# Patient Record
Sex: Female | Born: 1940 | Race: White | Hispanic: No | Marital: Married | Smoking: Former smoker
Health system: Southern US, Community
[De-identification: ages and names within clinical notes are randomized; demographics above are authoritative.]

## PROBLEM LIST (undated history)

## (undated) DIAGNOSIS — I639 Cerebral infarction, unspecified: Secondary | ICD-10-CM

## (undated) DIAGNOSIS — C801 Malignant (primary) neoplasm, unspecified: Secondary | ICD-10-CM

## (undated) DIAGNOSIS — M1990 Unspecified osteoarthritis, unspecified site: Secondary | ICD-10-CM

## (undated) DIAGNOSIS — I10 Essential (primary) hypertension: Secondary | ICD-10-CM

## (undated) DIAGNOSIS — L719 Rosacea, unspecified: Secondary | ICD-10-CM

## (undated) DIAGNOSIS — L570 Actinic keratosis: Secondary | ICD-10-CM

## (undated) DIAGNOSIS — E785 Hyperlipidemia, unspecified: Secondary | ICD-10-CM

## (undated) DIAGNOSIS — L900 Lichen sclerosus et atrophicus: Secondary | ICD-10-CM

## (undated) DIAGNOSIS — E119 Type 2 diabetes mellitus without complications: Secondary | ICD-10-CM

## (undated) DIAGNOSIS — L80 Vitiligo: Secondary | ICD-10-CM

## (undated) HISTORY — DX: Vitiligo: L80

## (undated) HISTORY — DX: Rosacea, unspecified: L71.9

## (undated) HISTORY — DX: Type 2 diabetes mellitus without complications: E11.9

## (undated) HISTORY — DX: Hyperlipidemia, unspecified: E78.5

## (undated) HISTORY — DX: Lichen sclerosus et atrophicus: L90.0

## (undated) HISTORY — DX: Actinic keratosis: L57.0

## (undated) HISTORY — PX: TUBAL LIGATION: SHX77

## (undated) HISTORY — DX: Cerebral infarction, unspecified: I63.9

---

## 1989-02-24 HISTORY — PX: BREAST EXCISIONAL BIOPSY: SUR124

## 1989-02-24 HISTORY — PX: BREAST SURGERY: SHX581

## 2001-06-04 ENCOUNTER — Other Ambulatory Visit: Admission: RE | Admit: 2001-06-04 | Discharge: 2001-06-04 | Payer: Self-pay | Admitting: Family Medicine

## 2004-01-10 ENCOUNTER — Ambulatory Visit: Payer: Self-pay | Admitting: Family Medicine

## 2005-03-27 ENCOUNTER — Ambulatory Visit: Payer: Self-pay

## 2005-05-29 ENCOUNTER — Ambulatory Visit: Payer: Self-pay | Admitting: Gastroenterology

## 2006-04-01 ENCOUNTER — Ambulatory Visit: Payer: Self-pay

## 2007-04-15 ENCOUNTER — Ambulatory Visit: Payer: Self-pay | Admitting: Family Medicine

## 2008-04-20 ENCOUNTER — Ambulatory Visit: Payer: Self-pay | Admitting: Family Medicine

## 2008-05-09 ENCOUNTER — Ambulatory Visit: Payer: Self-pay | Admitting: Gastroenterology

## 2009-02-24 HISTORY — PX: ROTATOR CUFF REPAIR: SHX139

## 2009-04-10 LAB — HM PAP SMEAR: HM Pap smear: NORMAL

## 2009-04-24 ENCOUNTER — Ambulatory Visit: Payer: Self-pay | Admitting: Family Medicine

## 2009-04-26 ENCOUNTER — Ambulatory Visit: Payer: Self-pay | Admitting: Family Medicine

## 2009-07-01 HISTORY — PX: DILATION AND CURETTAGE OF UTERUS: SHX78

## 2009-09-09 ENCOUNTER — Emergency Department: Payer: Self-pay | Admitting: Emergency Medicine

## 2009-11-07 ENCOUNTER — Ambulatory Visit: Payer: Self-pay | Admitting: Specialist

## 2010-01-03 ENCOUNTER — Ambulatory Visit: Payer: Self-pay | Admitting: Specialist

## 2010-01-09 ENCOUNTER — Ambulatory Visit: Payer: Self-pay | Admitting: Anesthesiology

## 2010-01-10 ENCOUNTER — Ambulatory Visit: Payer: Self-pay | Admitting: Specialist

## 2010-11-25 HISTORY — PX: CATARACT EXTRACTION, BILATERAL: SHX1313

## 2010-12-04 ENCOUNTER — Ambulatory Visit: Payer: Self-pay | Admitting: Ophthalmology

## 2011-07-09 ENCOUNTER — Ambulatory Visit: Payer: Self-pay | Admitting: Family Medicine

## 2011-07-28 ENCOUNTER — Ambulatory Visit: Payer: Self-pay | Admitting: Family Medicine

## 2011-08-25 ENCOUNTER — Ambulatory Visit: Payer: Self-pay | Admitting: Family Medicine

## 2012-04-23 ENCOUNTER — Ambulatory Visit: Payer: Self-pay | Admitting: Family Medicine

## 2012-05-05 ENCOUNTER — Ambulatory Visit: Payer: Self-pay | Admitting: Family Medicine

## 2012-07-05 ENCOUNTER — Ambulatory Visit: Payer: Self-pay | Admitting: Family Medicine

## 2012-07-15 ENCOUNTER — Ambulatory Visit: Payer: Self-pay | Admitting: Family Medicine

## 2012-07-26 ENCOUNTER — Ambulatory Visit: Payer: Self-pay | Admitting: Family Medicine

## 2012-07-28 ENCOUNTER — Ambulatory Visit: Payer: Self-pay | Admitting: Family Medicine

## 2012-08-18 ENCOUNTER — Ambulatory Visit: Payer: Self-pay | Admitting: Family Medicine

## 2012-08-18 LAB — HM DEXA SCAN

## 2012-10-13 ENCOUNTER — Ambulatory Visit: Payer: Self-pay | Admitting: Ophthalmology

## 2012-10-22 HISTORY — PX: EYE SURGERY: SHX253

## 2013-02-24 HISTORY — PX: COLONOSCOPY: SHX174

## 2013-07-27 LAB — HEMOGLOBIN A1C: Hgb A1c MFr Bld: 7.1 % — AB (ref 4.0–6.0)

## 2013-08-02 DIAGNOSIS — I495 Sick sinus syndrome: Secondary | ICD-10-CM | POA: Insufficient documentation

## 2013-08-02 DIAGNOSIS — E139 Other specified diabetes mellitus without complications: Secondary | ICD-10-CM | POA: Insufficient documentation

## 2013-08-02 DIAGNOSIS — I499 Cardiac arrhythmia, unspecified: Secondary | ICD-10-CM | POA: Insufficient documentation

## 2013-08-02 DIAGNOSIS — I10 Essential (primary) hypertension: Secondary | ICD-10-CM | POA: Insufficient documentation

## 2013-11-01 ENCOUNTER — Ambulatory Visit: Payer: Self-pay | Admitting: Family Medicine

## 2013-11-01 LAB — HM MAMMOGRAPHY

## 2013-12-29 ENCOUNTER — Ambulatory Visit: Payer: Self-pay | Admitting: Gastroenterology

## 2014-01-08 LAB — HM COLONOSCOPY: HM COLON: NORMAL

## 2014-05-23 LAB — LIPID PANEL
CHOLESTEROL: 178 mg/dL (ref 0–200)
HDL: 63 mg/dL (ref 35–70)
LDL Cholesterol: 100 mg/dL
LDL/HDL RATIO: 2.8
TRIGLYCERIDES: 75 mg/dL (ref 40–160)

## 2014-06-22 DIAGNOSIS — M5412 Radiculopathy, cervical region: Secondary | ICD-10-CM | POA: Insufficient documentation

## 2014-07-25 DIAGNOSIS — D369 Benign neoplasm, unspecified site: Secondary | ICD-10-CM | POA: Insufficient documentation

## 2014-07-25 DIAGNOSIS — M542 Cervicalgia: Secondary | ICD-10-CM | POA: Insufficient documentation

## 2014-07-25 DIAGNOSIS — E7800 Pure hypercholesterolemia, unspecified: Secondary | ICD-10-CM | POA: Insufficient documentation

## 2014-07-25 DIAGNOSIS — S32000A Wedge compression fracture of unspecified lumbar vertebra, initial encounter for closed fracture: Secondary | ICD-10-CM | POA: Insufficient documentation

## 2014-07-25 DIAGNOSIS — R634 Abnormal weight loss: Secondary | ICD-10-CM | POA: Insufficient documentation

## 2014-07-25 DIAGNOSIS — K5900 Constipation, unspecified: Secondary | ICD-10-CM | POA: Insufficient documentation

## 2014-07-25 DIAGNOSIS — G939 Disorder of brain, unspecified: Secondary | ICD-10-CM | POA: Insufficient documentation

## 2014-07-25 DIAGNOSIS — L900 Lichen sclerosus et atrophicus: Secondary | ICD-10-CM | POA: Insufficient documentation

## 2014-07-25 DIAGNOSIS — M810 Age-related osteoporosis without current pathological fracture: Secondary | ICD-10-CM | POA: Insufficient documentation

## 2014-07-25 DIAGNOSIS — I6782 Cerebral ischemia: Secondary | ICD-10-CM | POA: Insufficient documentation

## 2014-07-25 DIAGNOSIS — M9979 Connective tissue and disc stenosis of intervertebral foramina of abdomen and other regions: Secondary | ICD-10-CM | POA: Insufficient documentation

## 2014-07-25 DIAGNOSIS — N12 Tubulo-interstitial nephritis, not specified as acute or chronic: Secondary | ICD-10-CM | POA: Insufficient documentation

## 2014-09-05 LAB — HEMOGLOBIN A1C: HEMOGLOBIN A1C: 6.7 % — AB (ref 4.0–6.0)

## 2014-09-05 LAB — BASIC METABOLIC PANEL
Creatinine: 0.8 mg/dL (ref <=1.1)
Glucose: 161 mg/dL
Potassium: 4.3 mmol/L (ref 3.4–5.3)
Sodium: 134 mmol/L — AB (ref 137–147)

## 2014-10-23 ENCOUNTER — Ambulatory Visit
Admission: RE | Admit: 2014-10-23 | Discharge: 2014-10-23 | Disposition: A | Discharge status: Home / Self Care | Payer: Commercial Managed Care - HMO | Source: Ambulatory Visit | Attending: Family Medicine | Admitting: Family Medicine

## 2014-10-23 ENCOUNTER — Encounter: Payer: Self-pay | Admitting: Family Medicine

## 2014-10-23 ENCOUNTER — Ambulatory Visit (INDEPENDENT_AMBULATORY_CARE_PROVIDER_SITE_OTHER): Payer: Commercial Managed Care - HMO | Admitting: Family Medicine

## 2014-10-23 VITALS — BP 140/80 | HR 70 | Temp 98.1°F | Resp 16 | Ht 66.5 in | Wt 114.8 lb

## 2014-10-23 DIAGNOSIS — M25552 Pain in left hip: Secondary | ICD-10-CM | POA: Insufficient documentation

## 2014-10-23 DIAGNOSIS — Z1239 Encounter for other screening for malignant neoplasm of breast: Secondary | ICD-10-CM

## 2014-10-23 DIAGNOSIS — E139 Other specified diabetes mellitus without complications: Secondary | ICD-10-CM

## 2014-10-23 DIAGNOSIS — Z0000 Encounter for general adult medical examination without abnormal findings: Secondary | ICD-10-CM | POA: Diagnosis not present

## 2014-10-23 DIAGNOSIS — M25559 Pain in unspecified hip: Secondary | ICD-10-CM | POA: Insufficient documentation

## 2014-10-23 DIAGNOSIS — M810 Age-related osteoporosis without current pathological fracture: Secondary | ICD-10-CM

## 2014-10-23 MED ORDER — MELOXICAM 15 MG PO TABS: 15.0000 mg | ORAL_TABLET | Freq: Every day | ORAL | Status: DC

## 2014-10-23 NOTE — Progress Notes (Signed)
Patient: Denise Sherman, Female    DOB: [birth year], 74 y.o.   MRN: [id number] Visit Date: 10/23/2014  Today's Provider: Margarita Rana, MD   Chief Complaint  Patient presents with  . Medicare Wellness  . Hip Pain   Subjective:    Annual wellness visit Denise Sherman is a 74 y.o. female. She feels well. She reports exercising walks twice a day 3-4 times a week . She reports she is sleeping well. Reviewed her PMH and all age appropriate screening as noted.   Follows a healthy diet. Does follow up with endocrinologist for her diabetes.   10/19/13 CPE 04/10/09 PAP-neg 11/01/13 Mammo-BI-RADS 1 12/29/13 Colon-Normal 08/18/12 BMD Osteoporosis 07/03/11 EKG   Also having hip pain. Has seen chiropractor which helped initially, but is no longer helping.   Does not hurt when lying down. Hurts when first starts moving the worst and does get some better with activity, but is really limiting what she does.   -----------------------------------------------------------   Review of Systems  Constitutional: Positive for activity change (having problems with left leg).  HENT: Negative.   Eyes: Negative.   Cardiovascular: Negative.   Gastrointestinal: Negative.   Endocrine: Negative.   Genitourinary: Negative.   Musculoskeletal: Positive for myalgias.  Skin: Negative.   Allergic/Immunologic: Negative.   Neurological: Negative.   Hematological: Negative.   Psychiatric/Behavioral: Negative.     Social History   Social History  . Marital Status: Married    Spouse Name: N/A  . Number of Children: N/A  . Years of Education: N/A   Occupational History  . Not on file.   Social History Main Topics  . Smoking status: Former Smoker    Types: Cigarettes    Quit date: 02/24/1971  . Smokeless tobacco: Not on file  . Alcohol Use: Yes     Comment: Occasional beer  . Drug Use: No  . Sexual Activity: Not on file   Other Topics Concern  . Not on file   Social History Narrative     Patient Active Problem List   Diagnosis Date Noted  . Hip pain 10/23/2014  . Compression fracture of lumbar vertebra 07/25/2014  . CN (constipation) 07/25/2014  . Narrowing of intervertebral disc space 07/25/2014  . Hypercholesteremia 07/25/2014  . Lichen sclerosus 12/10/5100  . Cervical pain 07/25/2014  . OP (osteoporosis) 07/25/2014  . Nephropyelitis 07/25/2014  . Temporary cerebral vascular dysfunction 07/25/2014  . Tubular adenoma 07/25/2014  . Abnormal loss of weight 07/25/2014  . Cervical radiculitis 06/22/2014  . Benign essential HTN 08/02/2013  . Diabetes mellitus type 1.5 08/02/2013  . Arrhythmia, sinus node 08/02/2013    Past Surgical History  Procedure Laterality Date  . Eye surgery Left 10/22/2012    cataract extraction  . Cataract extraction, bilateral  11/2010  . Rotator cuff repair Right 2011  . Dilation and curettage of uterus  07/01/2009    Dr. Laurey Morale; hysteroscopy  . Breast surgery Left 1991    lumpectomy  . Tubal ligation      Her family history includes Diabetes in her son.    Previous Medications   ASPIRIN 325 MG TABLET    Take by mouth.   GLUCOSE BLOOD TEST STRIP       HYDROCHLOROTHIAZIDE (HYDRODIURIL) 25 MG TABLET    Take by mouth.   INSULIN ASPART (NOVOLOG) 100 UNIT/ML INJECTION    Inject into the skin.   INSULIN GLARGINE (TOUJEO SOLOSTAR) 300 UNIT/ML SOPN    Inject 9 Units  into the skin daily.   LACTOBACILLUS (DIGESTIVE HEALTH PROBIOTIC) CAPS    Take by mouth.   MULTIPLE VITAMIN TABLET    Take by mouth.    Patient Care Team: Margarita Rana, MD as PCP - General (Family Medicine)     Objective:   Vitals: BP 140/80 mmHg  Pulse 70  Temp(Src) 98.1 F (36.7 C) (Oral)  Resp 16  Ht 5' 6.5" (1.689 m)  Wt 114 lb 12.8 oz (52.073 kg)  BMI 18.25 kg/m2  Physical Exam  Constitutional: She is oriented to person, place, and time. She appears well-developed and well-nourished.  HENT:  Head: Normocephalic and atraumatic.  Right Ear: External  ear normal.  Left Ear: External ear normal.  Nose: Nose normal.  Mouth/Throat: Oropharynx is clear and moist.  Eyes: Conjunctivae and EOM are normal. Pupils are equal, round, and reactive to light.  Neck: Normal range of motion. Neck supple.  Cardiovascular: Normal rate, regular rhythm, normal heart sounds and intact distal pulses.   Pulmonary/Chest: Effort normal and breath sounds normal. Right breast exhibits no inverted nipple and no mass. Left breast exhibits no inverted nipple and no mass.  Abdominal: Soft. Bowel sounds are normal.  Musculoskeletal: Normal range of motion.  Does have some tender over left hip bursa and down IT band.    Neurological: She is alert and oriented to person, place, and time.  Skin: Skin is warm and dry.  Psychiatric: She has a normal mood and affect. Her behavior is normal. Judgment and thought content normal.    Activities of Daily Living In your present state of health, do you have any difficulty performing the following activities: 10/23/2014  Hearing? N  Vision? N  Difficulty concentrating or making decisions? N  Walking or climbing stairs? N  Dressing or bathing? N  Doing errands, shopping? N    Fall Risk Assessment Fall Risk  10/23/2014  Falls in the past year? No     Depression Screen PHQ 2/9 Scores 10/23/2014  PHQ - 2 Score 0    Cognitive Testing - 6-CIT  Correct? Score   What year is it? yes 0 0 or 4  What month is it? yes 0 0 or 3  Memorize:    Pia Mau,  40,  High [address],  [place],      What time is it? (within 1 hour) yes 0 0 or 3  Count backwards from 20 yes 0 0, 2, or 4  Name the months of the year yes 0 0, 2, or 4  Repeat name & address above yes 0 0, 2, 4, 6, 8, or 10       TOTAL SCORE  0/28   Interpretation:  Normal  Normal (0-7) Abnormal (8-28)       Assessment & Plan:     Annual Wellness Visit  Reviewed patient's Family Medical History Reviewed and updated list of patient's medical providers Assessment of  cognitive impairment was done Assessed patient's functional ability Established a written schedule for health screening [place] Completed and Reviewed  Exercise Activities and Dietary recommendations Goals    . Exercise 150 minutes per week (moderate activity)       Immunization History  Administered Date(s) Administered  . Pneumococcal Conjugate-13 11/24/2013  . Td 04/06/2008    Health Maintenance  Topic Date Due  . FOOT EXAM  11/30/1948  . OPHTHALMOLOGY EXAM  11/30/1948  . URINE MICROALBUMIN  11/30/1948  . ZOSTAVAX  12/01/1998  . HEMOGLOBIN A1C  01/26/2014  . INFLUENZA VACCINE  09/25/2014  . PNA vac Low Risk Adult (2 of 2 - PPSV23) 11/25/2014  . TETANUS/TDAP  04/06/2018  . COLONOSCOPY  01/09/2024  . DEXA SCAN  Completed      1. Medicare annual wellness visit, subsequent Stable. Patient advised to continue eating healthy and exercise daily.  2. Diabetes mellitus type 1.5 Stable. Patient followed by Dr.Paul  Endocrinologist at [facility]. Labs updated in chart. Last Hemoglobin A1C was 6.7% Urine Albumin/Creatinine Ratio  27.5 3. OP (osteoporosis) - DG Bone Density; Future  4. Breast cancer screening - MM DIGITAL SCREENING BILATERAL; Future  5. Hip pain, left New problem. Patient started on Meloxicam 15 mg as below. Follow-up pending X- ray report. Will refer to Dr. Sharlet Salina if does not improve.   - DG HIP UNILAT W OR W/O PELVIS 2-3 VIEWS LEFT; Future - meloxicam (MOBIC) 15 MG tablet; Take 1 tablet (15 mg total) by mouth daily.  Dispense: 30 tablet; Refill: 0  Margarita Rana, MD    ------------------------------------------------------------------------------------------------------------

## 2014-10-24 ENCOUNTER — Telehealth: Payer: Self-pay

## 2014-10-24 ENCOUNTER — Telehealth: Payer: Self-pay | Admitting: Family Medicine

## 2014-10-24 NOTE — Telephone Encounter (Signed)
Pt advised of hip x-ray.   Thanks,   -Mickel Baas

## 2014-10-24 NOTE — Telephone Encounter (Signed)
Pt said she received a call today from [place] but didn't get a voice mail.  She thinks it was about her hip x ray yesterday.  Please call back [phone]  Thanks teri

## 2014-10-24 NOTE — Telephone Encounter (Signed)
Pt advised of Hip X-ray.   Thanks,   -Mickel Baas

## 2014-11-06 ENCOUNTER — Ambulatory Visit
Admission: RE | Admit: 2014-11-06 | Discharge: 2014-11-06 | Disposition: A | Discharge status: Home / Self Care | Payer: Commercial Managed Care - HMO | Source: Ambulatory Visit | Attending: Family Medicine | Admitting: Family Medicine

## 2014-11-06 ENCOUNTER — Other Ambulatory Visit: Payer: Self-pay | Admitting: Family Medicine

## 2014-11-06 DIAGNOSIS — Z1231 Encounter for screening mammogram for malignant neoplasm of breast: Secondary | ICD-10-CM | POA: Diagnosis not present

## 2014-11-06 DIAGNOSIS — M85862 Other specified disorders of bone density and structure, left lower leg: Secondary | ICD-10-CM | POA: Diagnosis not present

## 2014-11-06 DIAGNOSIS — M810 Age-related osteoporosis without current pathological fracture: Secondary | ICD-10-CM

## 2014-11-06 DIAGNOSIS — Z1239 Encounter for other screening for malignant neoplasm of breast: Secondary | ICD-10-CM

## 2014-11-07 ENCOUNTER — Telehealth: Payer: Self-pay

## 2014-11-07 NOTE — Telephone Encounter (Signed)
LMTCB Emily Drozdowski, CMA  

## 2014-11-07 NOTE — Telephone Encounter (Signed)
Patient advised as below. Patient will call back for appointment. sd

## 2014-11-07 NOTE — Telephone Encounter (Signed)
Pt is returning call.  PL#[phone]/QH

## 2014-11-07 NOTE — Telephone Encounter (Signed)
-----   Message from Margarita Rana, MD sent at 11/07/2014  8:16 AM EDT ----- Bone density does continue to show osteoporosis. Please clarify if patient has had treatment thru endocrinology.  If not, she would need to follow up with me or her endocrinologist to discuss treatment options. Thanks.

## 2014-11-30 ENCOUNTER — Ambulatory Visit (INDEPENDENT_AMBULATORY_CARE_PROVIDER_SITE_OTHER): Payer: Commercial Managed Care - HMO

## 2014-11-30 DIAGNOSIS — Z23 Encounter for immunization: Secondary | ICD-10-CM

## 2015-01-27 ENCOUNTER — Other Ambulatory Visit: Payer: Self-pay | Admitting: Family Medicine

## 2015-01-27 DIAGNOSIS — I10 Essential (primary) hypertension: Secondary | ICD-10-CM

## 2015-03-27 DIAGNOSIS — E139 Other specified diabetes mellitus without complications: Secondary | ICD-10-CM | POA: Diagnosis not present

## 2015-04-03 ENCOUNTER — Other Ambulatory Visit: Payer: Self-pay | Admitting: Family Medicine

## 2015-04-03 DIAGNOSIS — E785 Hyperlipidemia, unspecified: Secondary | ICD-10-CM | POA: Diagnosis not present

## 2015-04-03 DIAGNOSIS — E11649 Type 2 diabetes mellitus with hypoglycemia without coma: Secondary | ICD-10-CM | POA: Diagnosis not present

## 2015-04-03 DIAGNOSIS — I10 Essential (primary) hypertension: Secondary | ICD-10-CM | POA: Diagnosis not present

## 2015-04-03 DIAGNOSIS — E139 Other specified diabetes mellitus without complications: Secondary | ICD-10-CM | POA: Diagnosis not present

## 2015-04-03 MED ORDER — HYDROCHLOROTHIAZIDE 25 MG PO TABS: 25.0000 mg | ORAL_TABLET | Freq: Every day | ORAL | Status: DC

## 2015-04-03 NOTE — Telephone Encounter (Signed)
Patient needs a refill for hydrochlorothiazide (HYDRODIURIL) 25 MG tablet sent to [facility] on [address].

## 2015-04-19 ENCOUNTER — Other Ambulatory Visit: Payer: Self-pay | Admitting: Physical Medicine and Rehabilitation

## 2015-04-19 DIAGNOSIS — M5412 Radiculopathy, cervical region: Secondary | ICD-10-CM | POA: Diagnosis not present

## 2015-04-19 DIAGNOSIS — M5030 Other cervical disc degeneration, unspecified cervical region: Secondary | ICD-10-CM | POA: Diagnosis not present

## 2015-04-19 DIAGNOSIS — M5416 Radiculopathy, lumbar region: Secondary | ICD-10-CM

## 2015-04-19 DIAGNOSIS — M5136 Other intervertebral disc degeneration, lumbar region: Secondary | ICD-10-CM | POA: Diagnosis not present

## 2015-05-09 ENCOUNTER — Ambulatory Visit
Admission: RE | Admit: 2015-05-09 | Discharge: 2015-05-09 | Disposition: A | Discharge status: Home / Self Care | Payer: PPO | Source: Ambulatory Visit | Attending: Physical Medicine and Rehabilitation | Admitting: Physical Medicine and Rehabilitation

## 2015-05-09 DIAGNOSIS — M5416 Radiculopathy, lumbar region: Secondary | ICD-10-CM | POA: Insufficient documentation

## 2015-05-09 DIAGNOSIS — M4806 Spinal stenosis, lumbar region: Secondary | ICD-10-CM | POA: Diagnosis not present

## 2015-05-14 DIAGNOSIS — M5116 Intervertebral disc disorders with radiculopathy, lumbar region: Secondary | ICD-10-CM | POA: Insufficient documentation

## 2015-05-14 DIAGNOSIS — M5136 Other intervertebral disc degeneration, lumbar region: Secondary | ICD-10-CM | POA: Insufficient documentation

## 2015-05-14 DIAGNOSIS — M5416 Radiculopathy, lumbar region: Secondary | ICD-10-CM | POA: Diagnosis not present

## 2015-05-14 DIAGNOSIS — M4806 Spinal stenosis, lumbar region: Secondary | ICD-10-CM | POA: Diagnosis not present

## 2015-06-27 ENCOUNTER — Encounter: Payer: Self-pay | Admitting: Family Medicine

## 2015-06-27 ENCOUNTER — Ambulatory Visit (INDEPENDENT_AMBULATORY_CARE_PROVIDER_SITE_OTHER): Payer: PPO | Admitting: Family Medicine

## 2015-06-27 VITALS — BP 150/80 | HR 60 | Temp 97.5°F | Resp 16 | Ht 66.0 in | Wt 117.0 lb

## 2015-06-27 DIAGNOSIS — Z0000 Encounter for general adult medical examination without abnormal findings: Secondary | ICD-10-CM

## 2015-06-27 DIAGNOSIS — I10 Essential (primary) hypertension: Secondary | ICD-10-CM

## 2015-06-27 DIAGNOSIS — M810 Age-related osteoporosis without current pathological fracture: Secondary | ICD-10-CM | POA: Diagnosis not present

## 2015-06-27 MED ORDER — LISINOPRIL-HYDROCHLOROTHIAZIDE 10-12.5 MG PO TABS: 1.0000 | ORAL_TABLET | Freq: Every day | ORAL | Status: DC

## 2015-06-27 NOTE — Progress Notes (Signed)
Patient ID: Denise Sherman, female   DOB: 08-04-1940, 75 y.o.   MRN: [id number]       Patient: Denise Sherman, Female    DOB: [birth year], 75 y.o.   MRN: [id number] Visit Date: 06/27/2015  Today's Provider: Margarita Rana, MD   Chief Complaint  Patient presents with  . Medicare Wellness   Subjective:    Annual wellness visit RENLEIGH Sherman is a 75 y.o. female. She feels well. She reports exercising daily. She reports she is sleeping well.  10/23/14 AWE 04/10/09 pap-neg 11/06/14 Mammogram-BI-RADS 1 11/06/14 BMD-osteoporosis 01/08/14 Colonoscopy-normal -----------------------------------------------------------  Hypertension, follow-up:  BP Readings from Last 3 Encounters:  06/27/15 150/80  10/23/14 140/80  05/30/14 138/74    She was last seen for hypertension 8 months ago.  BP at that visit was 140/80. Management changes since that visit include no changes. She reports excellent compliance with treatment. She is not having side effects. She is exercising. She is adherent to low salt diet.   Outside blood pressures are stable in the morning and elevated in the afternoon. She is experiencing none.  Patient denies chest pain.   Cardiovascular risk factors include advanced age (older than 24 for men, 96 for women) and diabetes mellitus.  Use of agents associated with hypertension: none.    Weight trend: stable Wt Readings from Last 3 Encounters:  06/27/15 117 lb (53.071 kg)  10/23/14 114 lb 12.8 oz (52.073 kg)  05/30/14 115 lb (52.164 kg)   Current diet: in general, a "healthy" diet   ------------------------------------------------------------------------  Review of Systems  Constitutional: Negative.   HENT: Negative.   Eyes: Negative.   Respiratory: Negative.   Cardiovascular: Negative.   Gastrointestinal: Negative.   Endocrine: Negative.   Genitourinary: Negative.   Musculoskeletal: Negative.   Skin: Negative.   Allergic/Immunologic: Negative.   Neurological:  Negative.   Hematological: Negative.   Psychiatric/Behavioral: Negative.     Social History   Social History  . Marital Status: Married    Spouse Name: N/A  . Number of Children: N/A  . Years of Education: N/A   Occupational History  . Not on file.   Social History Main Topics  . Smoking status: Former Smoker    Types: Cigarettes    Quit date: 02/24/1971  . Smokeless tobacco: Never Used  . Alcohol Use: Yes     Comment: Occasional beer  . Drug Use: No  . Sexual Activity: Not on file   Other Topics Concern  . Not on file   Social History Narrative    Past Medical History  Diagnosis Date  . Hyperlipidemia   . Diabetes mellitus without complication ([place])   . Stroke ([place])   . Lichen sclerosus      Patient Active Problem List   Diagnosis Date Noted  . Degeneration of intervertebral disc of lumbar region 05/14/2015  . Neuritis or radiculitis due to rupture of lumbar intervertebral disc 05/14/2015  . Hip pain 10/23/2014  . Compression fracture of lumbar vertebra ([place]) 07/25/2014  . CN (constipation) 07/25/2014  . Narrowing of intervertebral disc space 07/25/2014  . Hypercholesteremia 07/25/2014  . Lichen sclerosus 26/37/8588  . Cervical pain 07/25/2014  . OP (osteoporosis) 07/25/2014  . Nephropyelitis 07/25/2014  . Temporary cerebral vascular dysfunction 07/25/2014  . Tubular adenoma 07/25/2014  . Abnormal loss of weight 07/25/2014  . Cervical radiculitis 06/22/2014  . Benign essential HTN 08/02/2013  . Diabetes mellitus type 1.5 ([place]) 08/02/2013  . Arrhythmia, sinus node ([place]) 08/02/2013  . 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 Sinoatrial node dysfunction ([place]) 08/02/2013    Past Surgical History  Procedure Laterality Date  . Eye surgery Left 10/22/2012    cataract extraction  . Cataract extraction, bilateral  11/2010  . Rotator cuff repair Right 2011  . Dilation and curettage of uterus  07/01/2009    Dr. Laurey Morale; hysteroscopy    . Breast surgery Left 1991    lumpectomy  . Tubal ligation    . Breast excisional biopsy Left     x15 years    Her family history includes Arthritis in her mother; Diabetes in her son.    Previous Medications   ASPIRIN 325 MG TABLET    Take by mouth.   GLUCOSE BLOOD TEST STRIP       HYDROCHLOROTHIAZIDE (HYDRODIURIL) 25 MG TABLET    Take 1 tablet (25 mg total) by mouth daily.   INSULIN ASPART (NOVOLOG) 100 UNIT/ML INJECTION    Inject into the skin. 3.5-5 units before meals   INSULIN GLARGINE (TOUJEO SOLOSTAR) 300 UNIT/ML SOPN    Inject 8 Units into the skin daily.    LACTOBACILLUS (DIGESTIVE HEALTH PROBIOTIC) CAPS    Take 1 capsule by mouth 2 (two) times daily.    MULTIPLE VITAMIN TABLET    Take 1 tablet by mouth daily.    VITAMIN D, ERGOCALCIFEROL, (DRISDOL) 50000 UNITS CAPS CAPSULE    Take 50,000 Units by mouth every 30 (thirty) days.    Patient Care Team: Margarita Rana, MD as PCP - General (Family Medicine)     Objective:   Vitals: BP 150/80 mmHg  Pulse 60  Temp(Src) 97.5 F (36.4 C) (Oral)  Resp 16  Ht '5\' 6"'$  (1.676 m)  Wt 117 lb (53.071 kg)  BMI 18.89 kg/m2  Physical Exam  Constitutional: She is oriented to person, place, and time. She appears well-developed and well-nourished.  HENT:  Head: Normocephalic and atraumatic.  Right Ear: Tympanic membrane, external ear and ear canal normal.  Left Ear: Tympanic membrane, external ear and ear canal normal.  Nose: Nose normal.  Mouth/Throat: Uvula is midline, oropharynx is clear and moist and mucous membranes are normal.  Eyes: Conjunctivae, EOM and lids are normal. Pupils are equal, round, and reactive to light.  Neck: Trachea normal and normal range of motion. Neck supple. Carotid bruit is not present. No thyroid mass and no thyromegaly present.  Cardiovascular: Normal rate, regular rhythm and normal heart sounds.   Pulmonary/Chest: Effort normal and breath sounds normal. Left breast exhibits inverted nipple.  Abdominal:  Soft. Normal appearance and bowel sounds are normal. There is no hepatosplenomegaly. There is no tenderness.  Genitourinary: No breast swelling, tenderness or discharge.  Musculoskeletal: Normal range of motion.  Lymphadenopathy:    She has no cervical adenopathy.    She has no axillary adenopathy.  Neurological: She is alert and oriented to person, place, and time. She has normal strength. No cranial nerve deficit.  Skin: Skin is warm, dry and intact.  Psychiatric: She has a normal mood and affect. Her speech is normal and behavior is normal. Judgment and thought content normal. Cognition and memory are normal.    Activities of Daily Living In your present state of health, do you have any difficulty performing the following activities: 06/27/2015 10/23/2014  Hearing? N N  Vision? N N  Difficulty concentrating or making decisions? N N  Walking or climbing stairs? N N  Dressing or bathing? N N  Doing errands, shopping? N N  Fall Risk Assessment Fall Risk  06/27/2015 10/23/2014  Falls in the past year? No No     Depression Screen PHQ 2/9 Scores 06/27/2015 10/23/2014  PHQ - 2 Score 0 0    Cognitive Testing - 6-CIT  Correct? Score   What year is it? yes 0 0 or 4  What month is it? yes 0 0 or 3  Memorize:    Pia Mau,  40,  [address]  [place],      What time is it? (within 1 hour) yes 0 0 or 3  Count backwards from 20 yes 0 0, 2, or 4  Name the months of the year yes 0 0, 2, or 4  Repeat name & address above yes 0 0, 2, 4, 6, 8, or 10       TOTAL SCORE  0/28   Interpretation:  Normal  Normal (0-7) Abnormal (8-28)    Assessment & Plan:     Annual Wellness Visit  Reviewed patient's Family Medical History Reviewed and updated list of patient's medical providers Assessment of cognitive impairment was done Assessed patient's functional ability Established a written schedule for health screening [place] Completed and Reviewed  Exercise Activities and  Dietary recommendations Goals    . Exercise 150 minutes per week (moderate activity)       Immunization History  Administered Date(s) Administered  . Influenza, High Dose Seasonal PF 11/30/2014  . Pneumococcal Conjugate-13 11/24/2013  . Td 04/06/2008      1. Medicare annual wellness visit, subsequent As above. Patient advised to discontinue Aspirin 325 mg and take Tylenol as need and Aspirin 81 mg daily.   2. Benign essential HTN Not at goal. Patient started on Lisinopril-HCTZ 10-12.5 mg as below. Patient advised to call in 4 weeks for lab order to have Met c rechecked. - lisinopril-hydrochlorothiazide (PRINZIDE,ZESTORETIC) 10-12.5 MG tablet; Take 1 tablet by mouth daily.  Dispense: 90 tablet; Refill: 3  3. OP (osteoporosis) Patient advised to talk to Dr. Eddie Dibbles endocrinology.    Patient seen and examined by Dr. Jerrell Belfast, and note scribed by Philbert Riser. Dimas, CMA.  I have reviewed the document for accuracy and completeness and I agree with above. Jerrell Belfast, MD   ------------------------------------------------------------------------------------------------------------

## 2015-06-27 NOTE — Patient Instructions (Addendum)
Please start taking Tylenol as needed for sleep and Aspirin 81 mg daily. Please call in 4 weeks to have lab rechecked after starting Lisinopril-Hydrochlorothiazide.

## 2015-07-12 DIAGNOSIS — E119 Type 2 diabetes mellitus without complications: Secondary | ICD-10-CM | POA: Diagnosis not present

## 2015-07-24 ENCOUNTER — Telehealth: Payer: Self-pay | Admitting: Family Medicine

## 2015-07-24 DIAGNOSIS — I10 Essential (primary) hypertension: Secondary | ICD-10-CM

## 2015-07-24 NOTE — Telephone Encounter (Signed)
Pt is requesting a lab clip to have labs rechecked.  CZ:[id number]

## 2015-07-24 NOTE — Telephone Encounter (Signed)
Ok to order. Thanks.   

## 2015-07-24 NOTE — Telephone Encounter (Signed)
Looks like she is due to have Met C checked.  Is it okay to order?   Thanks,   -Mickel Baas

## 2015-07-24 NOTE — Telephone Encounter (Signed)
Ordered as below. L/M saying that lab slip is ready.

## 2015-07-31 DIAGNOSIS — I10 Essential (primary) hypertension: Secondary | ICD-10-CM | POA: Diagnosis not present

## 2015-08-01 LAB — COMPREHENSIVE METABOLIC PANEL
A/G RATIO: 1.5 (ref 1.2–2.2)
ALBUMIN: 4 g/dL (ref 3.5–4.8)
ALK PHOS: 77 IU/L (ref 39–117)
ALT: 15 IU/L (ref 0–32)
AST: 19 IU/L (ref 0–40)
BILIRUBIN TOTAL: 0.6 mg/dL (ref 0.0–1.2)
BUN / CREAT RATIO: 14 (ref 12–28)
BUN: 11 mg/dL (ref 8–27)
CHLORIDE: 90 mmol/L — AB (ref 96–106)
CO2: 26 mmol/L (ref 18–29)
Calcium: 9.7 mg/dL (ref 8.7–10.3)
Creatinine, Ser: 0.78 mg/dL (ref 0.57–1.00)
GFR calc non Af Amer: 74 mL/min/{1.73_m2} (ref >59)
GFR, EST AFRICAN AMERICAN: 85 mL/min/{1.73_m2} (ref >59)
GLOBULIN, TOTAL: 2.6 g/dL (ref 1.5–4.5)
Glucose: 81 mg/dL (ref 65–99)
Potassium: 4.3 mmol/L (ref 3.5–5.2)
SODIUM: 131 mmol/L — AB (ref 134–144)
Total Protein: 6.6 g/dL (ref 6.0–8.5)

## 2015-09-18 ENCOUNTER — Encounter: Payer: Self-pay | Admitting: Family Medicine

## 2015-09-25 DIAGNOSIS — E109 Type 1 diabetes mellitus without complications: Secondary | ICD-10-CM | POA: Diagnosis not present

## 2015-10-02 DIAGNOSIS — I10 Essential (primary) hypertension: Secondary | ICD-10-CM | POA: Diagnosis not present

## 2015-10-02 DIAGNOSIS — E11649 Type 2 diabetes mellitus with hypoglycemia without coma: Secondary | ICD-10-CM | POA: Diagnosis not present

## 2015-10-02 DIAGNOSIS — E109 Type 1 diabetes mellitus without complications: Secondary | ICD-10-CM | POA: Diagnosis not present

## 2015-10-02 DIAGNOSIS — E785 Hyperlipidemia, unspecified: Secondary | ICD-10-CM | POA: Diagnosis not present

## 2015-11-21 DIAGNOSIS — L570 Actinic keratosis: Secondary | ICD-10-CM | POA: Diagnosis not present

## 2015-11-21 DIAGNOSIS — L578 Other skin changes due to chronic exposure to nonionizing radiation: Secondary | ICD-10-CM | POA: Diagnosis not present

## 2015-11-21 DIAGNOSIS — L80 Vitiligo: Secondary | ICD-10-CM | POA: Diagnosis not present

## 2015-11-21 DIAGNOSIS — Z1283 Encounter for screening for malignant neoplasm of skin: Secondary | ICD-10-CM | POA: Diagnosis not present

## 2015-11-21 DIAGNOSIS — L718 Other rosacea: Secondary | ICD-10-CM | POA: Diagnosis not present

## 2015-11-21 DIAGNOSIS — L821 Other seborrheic keratosis: Secondary | ICD-10-CM | POA: Diagnosis not present

## 2015-12-13 ENCOUNTER — Ambulatory Visit: Payer: PPO

## 2015-12-18 DIAGNOSIS — E109 Type 1 diabetes mellitus without complications: Secondary | ICD-10-CM | POA: Diagnosis not present

## 2015-12-18 DIAGNOSIS — I10 Essential (primary) hypertension: Secondary | ICD-10-CM | POA: Diagnosis not present

## 2015-12-18 DIAGNOSIS — Z23 Encounter for immunization: Secondary | ICD-10-CM | POA: Diagnosis not present

## 2015-12-18 DIAGNOSIS — E7800 Pure hypercholesterolemia, unspecified: Secondary | ICD-10-CM | POA: Diagnosis not present

## 2015-12-26 ENCOUNTER — Other Ambulatory Visit: Payer: Self-pay | Admitting: Family Medicine

## 2015-12-26 DIAGNOSIS — Z1231 Encounter for screening mammogram for malignant neoplasm of breast: Secondary | ICD-10-CM

## 2016-01-31 ENCOUNTER — Ambulatory Visit: Payer: PPO

## 2016-02-06 ENCOUNTER — Ambulatory Visit
Admission: RE | Admit: 2016-02-06 | Discharge: 2016-02-06 | Disposition: A | Discharge status: Home / Self Care | Payer: PPO | Source: Ambulatory Visit | Attending: Family Medicine | Admitting: Family Medicine

## 2016-02-06 DIAGNOSIS — Z1231 Encounter for screening mammogram for malignant neoplasm of breast: Secondary | ICD-10-CM | POA: Diagnosis not present

## 2016-02-06 DIAGNOSIS — R928 Other abnormal and inconclusive findings on diagnostic imaging of breast: Secondary | ICD-10-CM | POA: Insufficient documentation

## 2016-02-08 ENCOUNTER — Other Ambulatory Visit: Payer: Self-pay | Admitting: Family Medicine

## 2016-02-08 DIAGNOSIS — N6489 Other specified disorders of breast: Secondary | ICD-10-CM

## 2016-02-08 DIAGNOSIS — R928 Other abnormal and inconclusive findings on diagnostic imaging of breast: Secondary | ICD-10-CM

## 2016-02-15 DIAGNOSIS — J069 Acute upper respiratory infection, unspecified: Secondary | ICD-10-CM | POA: Diagnosis not present

## 2016-02-28 ENCOUNTER — Ambulatory Visit
Admission: RE | Admit: 2016-02-28 | Discharge: 2016-02-28 | Disposition: A | Discharge status: Home / Self Care | Payer: PPO | Source: Ambulatory Visit | Attending: Family Medicine | Admitting: Family Medicine

## 2016-02-28 DIAGNOSIS — R928 Other abnormal and inconclusive findings on diagnostic imaging of breast: Secondary | ICD-10-CM | POA: Insufficient documentation

## 2016-02-28 DIAGNOSIS — N6489 Other specified disorders of breast: Secondary | ICD-10-CM | POA: Diagnosis not present

## 2016-03-25 DIAGNOSIS — E109 Type 1 diabetes mellitus without complications: Secondary | ICD-10-CM | POA: Diagnosis not present

## 2016-04-01 DIAGNOSIS — E109 Type 1 diabetes mellitus without complications: Secondary | ICD-10-CM | POA: Diagnosis not present

## 2016-04-01 DIAGNOSIS — E785 Hyperlipidemia, unspecified: Secondary | ICD-10-CM | POA: Diagnosis not present

## 2016-04-01 DIAGNOSIS — E11649 Type 2 diabetes mellitus with hypoglycemia without coma: Secondary | ICD-10-CM | POA: Diagnosis not present

## 2016-04-01 DIAGNOSIS — I10 Essential (primary) hypertension: Secondary | ICD-10-CM | POA: Diagnosis not present

## 2016-04-30 ENCOUNTER — Telehealth: Payer: Self-pay

## 2016-04-30 NOTE — Telephone Encounter (Signed)
Pt is returning your call about scheduling wellness. Pt reports she is no longer a patient at [facility] since Dr. Venia Minks moved.

## 2016-05-29 ENCOUNTER — Telehealth: Payer: Self-pay | Admitting: Family Medicine

## 2016-05-29 NOTE — Telephone Encounter (Signed)
Called Pt to schedule AWV with [facility] - knb °

## 2016-06-16 ENCOUNTER — Other Ambulatory Visit: Payer: Self-pay | Admitting: Family Medicine

## 2016-06-16 NOTE — Telephone Encounter (Signed)
[facility] faxed a request on the following medication. Thanks CC  lisinopril-hydrochlorothiazide (PRINZIDE,ZESTORETIC) 10-12.5 MG tablet  Take 1 tablet by mouth daily.

## 2016-06-24 DIAGNOSIS — E7800 Pure hypercholesterolemia, unspecified: Secondary | ICD-10-CM | POA: Diagnosis not present

## 2016-06-30 DIAGNOSIS — Z0000 Encounter for general adult medical examination without abnormal findings: Secondary | ICD-10-CM | POA: Diagnosis not present

## 2016-06-30 DIAGNOSIS — E7800 Pure hypercholesterolemia, unspecified: Secondary | ICD-10-CM | POA: Diagnosis not present

## 2016-07-23 ENCOUNTER — Telehealth: Payer: Self-pay | Admitting: Family Medicine

## 2016-08-28 DIAGNOSIS — E119 Type 2 diabetes mellitus without complications: Secondary | ICD-10-CM | POA: Diagnosis not present

## 2016-09-23 DIAGNOSIS — E109 Type 1 diabetes mellitus without complications: Secondary | ICD-10-CM | POA: Diagnosis not present

## 2016-09-30 DIAGNOSIS — E109 Type 1 diabetes mellitus without complications: Secondary | ICD-10-CM | POA: Diagnosis not present

## 2016-09-30 DIAGNOSIS — E11649 Type 2 diabetes mellitus with hypoglycemia without coma: Secondary | ICD-10-CM | POA: Diagnosis not present

## 2016-09-30 DIAGNOSIS — I10 Essential (primary) hypertension: Secondary | ICD-10-CM | POA: Diagnosis not present

## 2016-09-30 DIAGNOSIS — E785 Hyperlipidemia, unspecified: Secondary | ICD-10-CM | POA: Diagnosis not present

## 2016-10-07 DIAGNOSIS — M5416 Radiculopathy, lumbar region: Secondary | ICD-10-CM | POA: Diagnosis not present

## 2016-10-07 DIAGNOSIS — M48062 Spinal stenosis, lumbar region with neurogenic claudication: Secondary | ICD-10-CM | POA: Diagnosis not present

## 2016-10-07 DIAGNOSIS — M5136 Other intervertebral disc degeneration, lumbar region: Secondary | ICD-10-CM | POA: Diagnosis not present

## 2016-11-26 NOTE — Telephone Encounter (Signed)
Issue resolved.

## 2016-12-03 DIAGNOSIS — Z23 Encounter for immunization: Secondary | ICD-10-CM | POA: Diagnosis not present

## 2016-12-10 DIAGNOSIS — L821 Other seborrheic keratosis: Secondary | ICD-10-CM | POA: Diagnosis not present

## 2016-12-10 DIAGNOSIS — L578 Other skin changes due to chronic exposure to nonionizing radiation: Secondary | ICD-10-CM | POA: Diagnosis not present

## 2016-12-10 DIAGNOSIS — Z1283 Encounter for screening for malignant neoplasm of skin: Secondary | ICD-10-CM | POA: Diagnosis not present

## 2016-12-10 DIAGNOSIS — L720 Epidermal cyst: Secondary | ICD-10-CM | POA: Diagnosis not present

## 2016-12-10 DIAGNOSIS — D1801 Hemangioma of skin and subcutaneous tissue: Secondary | ICD-10-CM | POA: Diagnosis not present

## 2016-12-10 DIAGNOSIS — L570 Actinic keratosis: Secondary | ICD-10-CM | POA: Diagnosis not present

## 2016-12-10 DIAGNOSIS — L814 Other melanin hyperpigmentation: Secondary | ICD-10-CM | POA: Diagnosis not present

## 2016-12-10 DIAGNOSIS — L719 Rosacea, unspecified: Secondary | ICD-10-CM | POA: Diagnosis not present

## 2016-12-10 DIAGNOSIS — L820 Inflamed seborrheic keratosis: Secondary | ICD-10-CM | POA: Diagnosis not present

## 2016-12-10 DIAGNOSIS — D229 Melanocytic nevi, unspecified: Secondary | ICD-10-CM | POA: Diagnosis not present

## 2017-02-11 NOTE — Telephone Encounter (Signed)
No longer [facility] patient

## 2017-03-12 DIAGNOSIS — L719 Rosacea, unspecified: Secondary | ICD-10-CM | POA: Diagnosis not present

## 2017-03-12 DIAGNOSIS — L570 Actinic keratosis: Secondary | ICD-10-CM | POA: Diagnosis not present

## 2017-03-12 DIAGNOSIS — L820 Inflamed seborrheic keratosis: Secondary | ICD-10-CM | POA: Diagnosis not present

## 2017-03-12 DIAGNOSIS — L821 Other seborrheic keratosis: Secondary | ICD-10-CM | POA: Diagnosis not present

## 2017-03-31 DIAGNOSIS — I10 Essential (primary) hypertension: Secondary | ICD-10-CM | POA: Diagnosis not present

## 2017-03-31 DIAGNOSIS — E109 Type 1 diabetes mellitus without complications: Secondary | ICD-10-CM | POA: Diagnosis not present

## 2017-03-31 DIAGNOSIS — E785 Hyperlipidemia, unspecified: Secondary | ICD-10-CM | POA: Diagnosis not present

## 2017-03-31 DIAGNOSIS — E11649 Type 2 diabetes mellitus with hypoglycemia without coma: Secondary | ICD-10-CM | POA: Diagnosis not present

## 2017-05-13 DIAGNOSIS — L578 Other skin changes due to chronic exposure to nonionizing radiation: Secondary | ICD-10-CM | POA: Diagnosis not present

## 2017-05-13 DIAGNOSIS — L570 Actinic keratosis: Secondary | ICD-10-CM | POA: Diagnosis not present

## 2017-05-13 DIAGNOSIS — L820 Inflamed seborrheic keratosis: Secondary | ICD-10-CM | POA: Diagnosis not present

## 2017-05-13 DIAGNOSIS — L719 Rosacea, unspecified: Secondary | ICD-10-CM | POA: Diagnosis not present

## 2017-05-13 DIAGNOSIS — L821 Other seborrheic keratosis: Secondary | ICD-10-CM | POA: Diagnosis not present

## 2017-05-30 ENCOUNTER — Other Ambulatory Visit: Payer: Self-pay | Admitting: Family Medicine

## 2017-05-30 MED ORDER — AMOXICILLIN 875 MG PO TABS: 875.0000 mg | ORAL_TABLET | Freq: Two times a day (BID) | ORAL | 0 refills | Status: DC

## 2017-06-22 ENCOUNTER — Other Ambulatory Visit: Payer: Self-pay | Admitting: Family Medicine

## 2017-06-22 DIAGNOSIS — Z1231 Encounter for screening mammogram for malignant neoplasm of breast: Secondary | ICD-10-CM

## 2017-06-25 DIAGNOSIS — E7800 Pure hypercholesterolemia, unspecified: Secondary | ICD-10-CM | POA: Diagnosis not present

## 2017-06-25 DIAGNOSIS — Z79899 Other long term (current) drug therapy: Secondary | ICD-10-CM | POA: Diagnosis not present

## 2017-06-26 DIAGNOSIS — E7800 Pure hypercholesterolemia, unspecified: Secondary | ICD-10-CM | POA: Diagnosis not present

## 2017-06-26 DIAGNOSIS — Z79899 Other long term (current) drug therapy: Secondary | ICD-10-CM | POA: Diagnosis not present

## 2017-06-29 DIAGNOSIS — M5136 Other intervertebral disc degeneration, lumbar region: Secondary | ICD-10-CM | POA: Diagnosis not present

## 2017-06-29 DIAGNOSIS — M5416 Radiculopathy, lumbar region: Secondary | ICD-10-CM | POA: Diagnosis not present

## 2017-06-29 DIAGNOSIS — M545 Low back pain: Secondary | ICD-10-CM | POA: Diagnosis not present

## 2017-06-29 DIAGNOSIS — M48062 Spinal stenosis, lumbar region with neurogenic claudication: Secondary | ICD-10-CM | POA: Diagnosis not present

## 2017-07-02 DIAGNOSIS — Z0000 Encounter for general adult medical examination without abnormal findings: Secondary | ICD-10-CM | POA: Diagnosis not present

## 2017-07-06 ENCOUNTER — Telehealth: Payer: Self-pay | Admitting: Family Medicine

## 2017-07-08 DIAGNOSIS — M5136 Other intervertebral disc degeneration, lumbar region: Secondary | ICD-10-CM | POA: Diagnosis not present

## 2017-07-08 DIAGNOSIS — M5416 Radiculopathy, lumbar region: Secondary | ICD-10-CM | POA: Diagnosis not present

## 2017-07-08 DIAGNOSIS — M48062 Spinal stenosis, lumbar region with neurogenic claudication: Secondary | ICD-10-CM | POA: Diagnosis not present

## 2017-07-10 ENCOUNTER — Ambulatory Visit
Admission: RE | Admit: 2017-07-10 | Discharge: 2017-07-10 | Disposition: A | Discharge status: Home / Self Care | Payer: PPO | Source: Ambulatory Visit | Attending: Family Medicine | Admitting: Family Medicine

## 2017-07-10 DIAGNOSIS — Z1231 Encounter for screening mammogram for malignant neoplasm of breast: Secondary | ICD-10-CM

## 2017-10-07 DIAGNOSIS — E1069 Type 1 diabetes mellitus with other specified complication: Secondary | ICD-10-CM | POA: Diagnosis not present

## 2017-10-07 DIAGNOSIS — E1159 Type 2 diabetes mellitus with other circulatory complications: Secondary | ICD-10-CM | POA: Diagnosis not present

## 2017-10-07 DIAGNOSIS — E785 Hyperlipidemia, unspecified: Secondary | ICD-10-CM | POA: Diagnosis not present

## 2017-10-07 DIAGNOSIS — E559 Vitamin D deficiency, unspecified: Secondary | ICD-10-CM | POA: Diagnosis not present

## 2017-10-07 DIAGNOSIS — I10 Essential (primary) hypertension: Secondary | ICD-10-CM | POA: Diagnosis not present

## 2017-10-07 DIAGNOSIS — E109 Type 1 diabetes mellitus without complications: Secondary | ICD-10-CM | POA: Diagnosis not present

## 2017-10-07 DIAGNOSIS — E139 Other specified diabetes mellitus without complications: Secondary | ICD-10-CM | POA: Diagnosis not present

## 2017-10-29 DIAGNOSIS — E119 Type 2 diabetes mellitus without complications: Secondary | ICD-10-CM | POA: Diagnosis not present

## 2017-11-26 DIAGNOSIS — M48062 Spinal stenosis, lumbar region with neurogenic claudication: Secondary | ICD-10-CM | POA: Diagnosis not present

## 2017-11-26 DIAGNOSIS — M5136 Other intervertebral disc degeneration, lumbar region: Secondary | ICD-10-CM | POA: Diagnosis not present

## 2017-11-26 DIAGNOSIS — M5416 Radiculopathy, lumbar region: Secondary | ICD-10-CM | POA: Diagnosis not present

## 2017-12-09 DIAGNOSIS — M9904 Segmental and somatic dysfunction of sacral region: Secondary | ICD-10-CM | POA: Diagnosis not present

## 2017-12-09 DIAGNOSIS — M9903 Segmental and somatic dysfunction of lumbar region: Secondary | ICD-10-CM | POA: Diagnosis not present

## 2017-12-09 DIAGNOSIS — M5127 Other intervertebral disc displacement, lumbosacral region: Secondary | ICD-10-CM | POA: Diagnosis not present

## 2017-12-10 DIAGNOSIS — D485 Neoplasm of uncertain behavior of skin: Secondary | ICD-10-CM | POA: Diagnosis not present

## 2017-12-10 DIAGNOSIS — L578 Other skin changes due to chronic exposure to nonionizing radiation: Secondary | ICD-10-CM | POA: Diagnosis not present

## 2017-12-10 DIAGNOSIS — L570 Actinic keratosis: Secondary | ICD-10-CM | POA: Diagnosis not present

## 2017-12-10 DIAGNOSIS — L905 Scar conditions and fibrosis of skin: Secondary | ICD-10-CM | POA: Diagnosis not present

## 2017-12-10 DIAGNOSIS — L719 Rosacea, unspecified: Secondary | ICD-10-CM | POA: Diagnosis not present

## 2017-12-10 DIAGNOSIS — L821 Other seborrheic keratosis: Secondary | ICD-10-CM | POA: Diagnosis not present

## 2017-12-11 DIAGNOSIS — M5127 Other intervertebral disc displacement, lumbosacral region: Secondary | ICD-10-CM | POA: Diagnosis not present

## 2017-12-11 DIAGNOSIS — M9903 Segmental and somatic dysfunction of lumbar region: Secondary | ICD-10-CM | POA: Diagnosis not present

## 2017-12-11 DIAGNOSIS — M9904 Segmental and somatic dysfunction of sacral region: Secondary | ICD-10-CM | POA: Diagnosis not present

## 2017-12-14 DIAGNOSIS — M9903 Segmental and somatic dysfunction of lumbar region: Secondary | ICD-10-CM | POA: Diagnosis not present

## 2017-12-14 DIAGNOSIS — M9904 Segmental and somatic dysfunction of sacral region: Secondary | ICD-10-CM | POA: Diagnosis not present

## 2017-12-14 DIAGNOSIS — M5127 Other intervertebral disc displacement, lumbosacral region: Secondary | ICD-10-CM | POA: Diagnosis not present

## 2017-12-17 DIAGNOSIS — M9903 Segmental and somatic dysfunction of lumbar region: Secondary | ICD-10-CM | POA: Diagnosis not present

## 2017-12-17 DIAGNOSIS — M9904 Segmental and somatic dysfunction of sacral region: Secondary | ICD-10-CM | POA: Diagnosis not present

## 2017-12-17 DIAGNOSIS — M5127 Other intervertebral disc displacement, lumbosacral region: Secondary | ICD-10-CM | POA: Diagnosis not present

## 2017-12-21 ENCOUNTER — Other Ambulatory Visit: Payer: Self-pay | Admitting: Physical Medicine and Rehabilitation

## 2017-12-21 DIAGNOSIS — M5416 Radiculopathy, lumbar region: Secondary | ICD-10-CM

## 2017-12-21 DIAGNOSIS — M48062 Spinal stenosis, lumbar region with neurogenic claudication: Secondary | ICD-10-CM | POA: Diagnosis not present

## 2017-12-21 DIAGNOSIS — M5136 Other intervertebral disc degeneration, lumbar region: Secondary | ICD-10-CM | POA: Diagnosis not present

## 2017-12-21 DIAGNOSIS — M5442 Lumbago with sciatica, left side: Secondary | ICD-10-CM | POA: Diagnosis not present

## 2017-12-21 DIAGNOSIS — M5441 Lumbago with sciatica, right side: Secondary | ICD-10-CM | POA: Diagnosis not present

## 2017-12-30 DIAGNOSIS — E7800 Pure hypercholesterolemia, unspecified: Secondary | ICD-10-CM | POA: Diagnosis not present

## 2017-12-30 DIAGNOSIS — Z79899 Other long term (current) drug therapy: Secondary | ICD-10-CM | POA: Diagnosis not present

## 2018-01-04 DIAGNOSIS — E1159 Type 2 diabetes mellitus with other circulatory complications: Secondary | ICD-10-CM | POA: Diagnosis not present

## 2018-01-04 DIAGNOSIS — Z23 Encounter for immunization: Secondary | ICD-10-CM | POA: Diagnosis not present

## 2018-01-04 DIAGNOSIS — E785 Hyperlipidemia, unspecified: Secondary | ICD-10-CM | POA: Diagnosis not present

## 2018-01-04 DIAGNOSIS — Z79899 Other long term (current) drug therapy: Secondary | ICD-10-CM | POA: Diagnosis not present

## 2018-01-04 DIAGNOSIS — E109 Type 1 diabetes mellitus without complications: Secondary | ICD-10-CM | POA: Diagnosis not present

## 2018-01-04 DIAGNOSIS — M5416 Radiculopathy, lumbar region: Secondary | ICD-10-CM | POA: Diagnosis not present

## 2018-01-04 DIAGNOSIS — I10 Essential (primary) hypertension: Secondary | ICD-10-CM | POA: Diagnosis not present

## 2018-01-07 ENCOUNTER — Ambulatory Visit
Admission: RE | Admit: 2018-01-07 | Discharge: 2018-01-07 | Disposition: A | Discharge status: Home / Self Care | Payer: PPO | Source: Ambulatory Visit | Attending: Physical Medicine and Rehabilitation | Admitting: Physical Medicine and Rehabilitation

## 2018-01-07 DIAGNOSIS — M5116 Intervertebral disc disorders with radiculopathy, lumbar region: Secondary | ICD-10-CM | POA: Diagnosis not present

## 2018-01-07 DIAGNOSIS — M545 Low back pain: Secondary | ICD-10-CM | POA: Diagnosis not present

## 2018-01-07 DIAGNOSIS — M48061 Spinal stenosis, lumbar region without neurogenic claudication: Secondary | ICD-10-CM | POA: Diagnosis not present

## 2018-01-07 DIAGNOSIS — M5126 Other intervertebral disc displacement, lumbar region: Secondary | ICD-10-CM | POA: Insufficient documentation

## 2018-01-07 DIAGNOSIS — M5416 Radiculopathy, lumbar region: Secondary | ICD-10-CM

## 2018-01-12 DIAGNOSIS — S32030A Wedge compression fracture of third lumbar vertebra, initial encounter for closed fracture: Secondary | ICD-10-CM | POA: Diagnosis not present

## 2018-01-12 DIAGNOSIS — M48062 Spinal stenosis, lumbar region with neurogenic claudication: Secondary | ICD-10-CM | POA: Diagnosis not present

## 2018-01-12 DIAGNOSIS — M4316 Spondylolisthesis, lumbar region: Secondary | ICD-10-CM | POA: Diagnosis not present

## 2018-01-12 DIAGNOSIS — M5136 Other intervertebral disc degeneration, lumbar region: Secondary | ICD-10-CM | POA: Diagnosis not present

## 2018-01-12 DIAGNOSIS — M47816 Spondylosis without myelopathy or radiculopathy, lumbar region: Secondary | ICD-10-CM | POA: Diagnosis not present

## 2018-01-19 DIAGNOSIS — L905 Scar conditions and fibrosis of skin: Secondary | ICD-10-CM | POA: Diagnosis not present

## 2018-01-19 DIAGNOSIS — L570 Actinic keratosis: Secondary | ICD-10-CM | POA: Diagnosis not present

## 2018-01-19 DIAGNOSIS — D485 Neoplasm of uncertain behavior of skin: Secondary | ICD-10-CM | POA: Diagnosis not present

## 2018-03-24 DIAGNOSIS — I10 Essential (primary) hypertension: Secondary | ICD-10-CM | POA: Diagnosis not present

## 2018-03-24 DIAGNOSIS — E1069 Type 1 diabetes mellitus with other specified complication: Secondary | ICD-10-CM | POA: Diagnosis not present

## 2018-03-24 DIAGNOSIS — E785 Hyperlipidemia, unspecified: Secondary | ICD-10-CM | POA: Diagnosis not present

## 2018-03-24 DIAGNOSIS — E1159 Type 2 diabetes mellitus with other circulatory complications: Secondary | ICD-10-CM | POA: Diagnosis not present

## 2018-03-24 DIAGNOSIS — E109 Type 1 diabetes mellitus without complications: Secondary | ICD-10-CM | POA: Diagnosis not present

## 2018-03-24 DIAGNOSIS — E559 Vitamin D deficiency, unspecified: Secondary | ICD-10-CM | POA: Diagnosis not present

## 2018-04-12 DIAGNOSIS — M5136 Other intervertebral disc degeneration, lumbar region: Secondary | ICD-10-CM | POA: Diagnosis not present

## 2018-04-12 DIAGNOSIS — I071 Rheumatic tricuspid insufficiency: Secondary | ICD-10-CM | POA: Diagnosis not present

## 2018-04-12 DIAGNOSIS — E871 Hypo-osmolality and hyponatremia: Secondary | ICD-10-CM | POA: Diagnosis not present

## 2018-04-12 DIAGNOSIS — M546 Pain in thoracic spine: Secondary | ICD-10-CM | POA: Diagnosis not present

## 2018-04-12 DIAGNOSIS — E119 Type 2 diabetes mellitus without complications: Secondary | ICD-10-CM | POA: Diagnosis not present

## 2018-04-12 DIAGNOSIS — M5134 Other intervertebral disc degeneration, thoracic region: Secondary | ICD-10-CM | POA: Diagnosis not present

## 2018-04-12 DIAGNOSIS — E1165 Type 2 diabetes mellitus with hyperglycemia: Secondary | ICD-10-CM | POA: Diagnosis not present

## 2018-04-12 DIAGNOSIS — S22080A Wedge compression fracture of T11-T12 vertebra, initial encounter for closed fracture: Secondary | ICD-10-CM | POA: Diagnosis not present

## 2018-04-12 DIAGNOSIS — E785 Hyperlipidemia, unspecified: Secondary | ICD-10-CM | POA: Diagnosis not present

## 2018-04-12 DIAGNOSIS — M545 Low back pain: Secondary | ICD-10-CM | POA: Diagnosis not present

## 2018-04-12 DIAGNOSIS — K56609 Unspecified intestinal obstruction, unspecified as to partial versus complete obstruction: Secondary | ICD-10-CM | POA: Diagnosis not present

## 2018-04-12 DIAGNOSIS — R55 Syncope and collapse: Secondary | ICD-10-CM | POA: Diagnosis not present

## 2018-04-12 DIAGNOSIS — I6529 Occlusion and stenosis of unspecified carotid artery: Secondary | ICD-10-CM | POA: Diagnosis not present

## 2018-04-12 DIAGNOSIS — M810 Age-related osteoporosis without current pathological fracture: Secondary | ICD-10-CM | POA: Diagnosis not present

## 2018-04-12 DIAGNOSIS — X58XXXA Exposure to other specified factors, initial encounter: Secondary | ICD-10-CM | POA: Diagnosis not present

## 2018-04-12 DIAGNOSIS — M4854XA Collapsed vertebra, not elsewhere classified, thoracic region, initial encounter for fracture: Secondary | ICD-10-CM | POA: Diagnosis not present

## 2018-04-12 DIAGNOSIS — S32020A Wedge compression fracture of second lumbar vertebra, initial encounter for closed fracture: Secondary | ICD-10-CM | POA: Diagnosis not present

## 2018-04-12 DIAGNOSIS — K5900 Constipation, unspecified: Secondary | ICD-10-CM | POA: Diagnosis not present

## 2018-04-12 DIAGNOSIS — M4856XA Collapsed vertebra, not elsewhere classified, lumbar region, initial encounter for fracture: Secondary | ICD-10-CM | POA: Diagnosis not present

## 2018-04-12 DIAGNOSIS — I10 Essential (primary) hypertension: Secondary | ICD-10-CM | POA: Diagnosis not present

## 2018-04-12 DIAGNOSIS — Z794 Long term (current) use of insulin: Secondary | ICD-10-CM | POA: Diagnosis not present

## 2018-04-12 DIAGNOSIS — Z87891 Personal history of nicotine dependence: Secondary | ICD-10-CM | POA: Diagnosis not present

## 2018-04-12 DIAGNOSIS — Z79899 Other long term (current) drug therapy: Secondary | ICD-10-CM | POA: Diagnosis not present

## 2018-04-12 DIAGNOSIS — S32030A Wedge compression fracture of third lumbar vertebra, initial encounter for closed fracture: Secondary | ICD-10-CM | POA: Diagnosis not present

## 2018-04-12 DIAGNOSIS — M48061 Spinal stenosis, lumbar region without neurogenic claudication: Secondary | ICD-10-CM | POA: Diagnosis not present

## 2018-04-12 DIAGNOSIS — S0990XA Unspecified injury of head, initial encounter: Secondary | ICD-10-CM | POA: Diagnosis not present

## 2018-04-12 DIAGNOSIS — A09 Infectious gastroenteritis and colitis, unspecified: Secondary | ICD-10-CM | POA: Diagnosis not present

## 2018-04-13 DIAGNOSIS — M545 Low back pain: Secondary | ICD-10-CM | POA: Diagnosis not present

## 2018-04-13 DIAGNOSIS — S32020A Wedge compression fracture of second lumbar vertebra, initial encounter for closed fracture: Secondary | ICD-10-CM | POA: Diagnosis not present

## 2018-04-13 DIAGNOSIS — R55 Syncope and collapse: Secondary | ICD-10-CM | POA: Diagnosis not present

## 2018-04-13 DIAGNOSIS — E871 Hypo-osmolality and hyponatremia: Secondary | ICD-10-CM | POA: Diagnosis not present

## 2018-04-13 DIAGNOSIS — E119 Type 2 diabetes mellitus without complications: Secondary | ICD-10-CM | POA: Diagnosis not present

## 2018-04-19 DIAGNOSIS — E1065 Type 1 diabetes mellitus with hyperglycemia: Secondary | ICD-10-CM | POA: Diagnosis not present

## 2018-04-19 DIAGNOSIS — S32020D Wedge compression fracture of second lumbar vertebra, subsequent encounter for fracture with routine healing: Secondary | ICD-10-CM | POA: Diagnosis not present

## 2018-04-19 DIAGNOSIS — K5903 Drug induced constipation: Secondary | ICD-10-CM | POA: Diagnosis not present

## 2018-04-19 DIAGNOSIS — I10 Essential (primary) hypertension: Secondary | ICD-10-CM | POA: Diagnosis not present

## 2018-04-20 DIAGNOSIS — M5126 Other intervertebral disc displacement, lumbar region: Secondary | ICD-10-CM | POA: Diagnosis not present

## 2018-04-20 DIAGNOSIS — S32020A Wedge compression fracture of second lumbar vertebra, initial encounter for closed fracture: Secondary | ICD-10-CM | POA: Diagnosis not present

## 2018-04-20 DIAGNOSIS — M4316 Spondylolisthesis, lumbar region: Secondary | ICD-10-CM | POA: Diagnosis not present

## 2018-04-20 DIAGNOSIS — M5416 Radiculopathy, lumbar region: Secondary | ICD-10-CM | POA: Diagnosis not present

## 2018-04-20 DIAGNOSIS — M47816 Spondylosis without myelopathy or radiculopathy, lumbar region: Secondary | ICD-10-CM | POA: Diagnosis not present

## 2018-04-26 DIAGNOSIS — M5416 Radiculopathy, lumbar region: Secondary | ICD-10-CM | POA: Diagnosis not present

## 2018-04-26 DIAGNOSIS — M48062 Spinal stenosis, lumbar region with neurogenic claudication: Secondary | ICD-10-CM | POA: Diagnosis not present

## 2018-04-26 DIAGNOSIS — M5136 Other intervertebral disc degeneration, lumbar region: Secondary | ICD-10-CM | POA: Diagnosis not present

## 2018-04-27 DIAGNOSIS — M5136 Other intervertebral disc degeneration, lumbar region: Secondary | ICD-10-CM | POA: Diagnosis not present

## 2018-04-27 DIAGNOSIS — M5416 Radiculopathy, lumbar region: Secondary | ICD-10-CM | POA: Diagnosis not present

## 2018-04-27 DIAGNOSIS — M48062 Spinal stenosis, lumbar region with neurogenic claudication: Secondary | ICD-10-CM | POA: Diagnosis not present

## 2018-04-28 DIAGNOSIS — S32020D Wedge compression fracture of second lumbar vertebra, subsequent encounter for fracture with routine healing: Secondary | ICD-10-CM | POA: Diagnosis not present

## 2018-04-29 ENCOUNTER — Ambulatory Visit: Payer: PPO | Admitting: Anesthesiology

## 2018-04-29 ENCOUNTER — Encounter
Admission: RE | Disposition: A | Discharge status: Home / Self Care | Payer: Self-pay | Source: Home / Self Care | Attending: Orthopedic Surgery

## 2018-04-29 ENCOUNTER — Ambulatory Visit: Payer: PPO

## 2018-04-29 ENCOUNTER — Encounter: Payer: Self-pay | Admitting: *Deleted

## 2018-04-29 ENCOUNTER — Ambulatory Visit
Admission: RE | Admit: 2018-04-29 | Discharge: 2018-04-29 | Disposition: A | Discharge status: Home / Self Care | Payer: PPO | Attending: Orthopedic Surgery | Admitting: Orthopedic Surgery

## 2018-04-29 DIAGNOSIS — Z87891 Personal history of nicotine dependence: Secondary | ICD-10-CM | POA: Diagnosis not present

## 2018-04-29 DIAGNOSIS — Z981 Arthrodesis status: Secondary | ICD-10-CM | POA: Diagnosis not present

## 2018-04-29 DIAGNOSIS — W19XXXA Unspecified fall, initial encounter: Secondary | ICD-10-CM | POA: Diagnosis not present

## 2018-04-29 DIAGNOSIS — Z79899 Other long term (current) drug therapy: Secondary | ICD-10-CM | POA: Diagnosis not present

## 2018-04-29 DIAGNOSIS — Z794 Long term (current) use of insulin: Secondary | ICD-10-CM | POA: Insufficient documentation

## 2018-04-29 DIAGNOSIS — I10 Essential (primary) hypertension: Secondary | ICD-10-CM | POA: Diagnosis not present

## 2018-04-29 DIAGNOSIS — E119 Type 2 diabetes mellitus without complications: Secondary | ICD-10-CM | POA: Diagnosis not present

## 2018-04-29 DIAGNOSIS — M4856XA Collapsed vertebra, not elsewhere classified, lumbar region, initial encounter for fracture: Secondary | ICD-10-CM | POA: Diagnosis not present

## 2018-04-29 DIAGNOSIS — Z419 Encounter for procedure for purposes other than remedying health state, unspecified: Secondary | ICD-10-CM

## 2018-04-29 DIAGNOSIS — S32020A Wedge compression fracture of second lumbar vertebra, initial encounter for closed fracture: Secondary | ICD-10-CM | POA: Insufficient documentation

## 2018-04-29 DIAGNOSIS — E109 Type 1 diabetes mellitus without complications: Secondary | ICD-10-CM | POA: Diagnosis not present

## 2018-04-29 DIAGNOSIS — E785 Hyperlipidemia, unspecified: Secondary | ICD-10-CM | POA: Diagnosis not present

## 2018-04-29 DIAGNOSIS — Z791 Long term (current) use of non-steroidal anti-inflammatories (NSAID): Secondary | ICD-10-CM | POA: Diagnosis not present

## 2018-04-29 DIAGNOSIS — Z8673 Personal history of transient ischemic attack (TIA), and cerebral infarction without residual deficits: Secondary | ICD-10-CM | POA: Diagnosis not present

## 2018-04-29 DIAGNOSIS — Z7982 Long term (current) use of aspirin: Secondary | ICD-10-CM | POA: Diagnosis not present

## 2018-04-29 DIAGNOSIS — E7800 Pure hypercholesterolemia, unspecified: Secondary | ICD-10-CM | POA: Diagnosis not present

## 2018-04-29 DIAGNOSIS — R001 Bradycardia, unspecified: Secondary | ICD-10-CM | POA: Diagnosis not present

## 2018-04-29 HISTORY — PX: KYPHOPLASTY: SHX5884

## 2018-04-29 LAB — GLUCOSE, CAPILLARY
Glucose-Capillary: 269 mg/dL — ABNORMAL HIGH (ref 70–99)
Glucose-Capillary: 226 mg/dL — ABNORMAL HIGH (ref 70–99)

## 2018-04-29 SURGERY — KYPHOPLASTY
Anesthesia: Monitor Anesthesia Care | Site: Spine Lumbar

## 2018-04-29 MED ORDER — CEFAZOLIN SODIUM-DEXTROSE 1-4 GM/50ML-% IV SOLN
1.0000 g | Freq: Once | INTRAVENOUS | Status: AC
  Administered 2018-04-29: 1 g via INTRAVENOUS

## 2018-04-29 MED ORDER — SODIUM CHLORIDE 0.9 % IV SOLN
INTRAVENOUS | Status: DC
  Administered 2018-04-29 (×2): via INTRAVENOUS

## 2018-04-29 MED ORDER — SODIUM CHLORIDE 0.9 % IV SOLN: INTRAVENOUS | Status: DC

## 2018-04-29 MED ORDER — BUPIVACAINE-EPINEPHRINE (PF) 0.5% -1:200000 IJ SOLN
INTRAMUSCULAR | Status: DC | PRN
  Administered 2018-04-29: 20 mL

## 2018-04-29 MED ORDER — PROPOFOL 10 MG/ML IV BOLUS
INTRAVENOUS | Status: DC | PRN
  Administered 2018-04-29: 20 mg via INTRAVENOUS

## 2018-04-29 MED ORDER — LIDOCAINE HCL (PF) 1 % IJ SOLN
INTRAMUSCULAR | Status: AC
  Filled 2018-04-29: qty 30

## 2018-04-29 MED ORDER — ONDANSETRON HCL 4 MG/2ML IJ SOLN: 4.0000 mg | Freq: Once | INTRAMUSCULAR | Status: DC | PRN

## 2018-04-29 MED ORDER — LIDOCAINE HCL 1 % IJ SOLN
INTRAMUSCULAR | Status: DC | PRN
  Administered 2018-04-29: 20 mL

## 2018-04-29 MED ORDER — FENTANYL CITRATE (PF) 100 MCG/2ML IJ SOLN
INTRAMUSCULAR | Status: AC
  Filled 2018-04-29: qty 2

## 2018-04-29 MED ORDER — METOCLOPRAMIDE HCL 5 MG/ML IJ SOLN: 5.0000 mg | Freq: Three times a day (TID) | INTRAMUSCULAR | Status: DC | PRN

## 2018-04-29 MED ORDER — METOCLOPRAMIDE HCL 10 MG PO TABS: 5.0000 mg | ORAL_TABLET | Freq: Three times a day (TID) | ORAL | Status: DC | PRN

## 2018-04-29 MED ORDER — CEFAZOLIN SODIUM-DEXTROSE 2-4 GM/100ML-% IV SOLN
INTRAVENOUS | Status: AC
  Filled 2018-04-29: qty 100

## 2018-04-29 MED ORDER — ONDANSETRON HCL 4 MG/2ML IJ SOLN: 4.0000 mg | Freq: Four times a day (QID) | INTRAMUSCULAR | Status: DC | PRN

## 2018-04-29 MED ORDER — HYDROCODONE-ACETAMINOPHEN 5-325 MG PO TABS: 1.0000 | ORAL_TABLET | ORAL | Status: DC | PRN

## 2018-04-29 MED ORDER — GLYCOPYRROLATE 0.2 MG/ML IJ SOLN
INTRAMUSCULAR | Status: DC | PRN
  Administered 2018-04-29: 0.2 mg via INTRAVENOUS

## 2018-04-29 MED ORDER — CEFAZOLIN SODIUM-DEXTROSE 1-4 GM/50ML-% IV SOLN
INTRAVENOUS | Status: AC
  Filled 2018-04-29: qty 50

## 2018-04-29 MED ORDER — PROPOFOL 10 MG/ML IV BOLUS
INTRAVENOUS | Status: AC
  Filled 2018-04-29: qty 40

## 2018-04-29 MED ORDER — BUPIVACAINE HCL (PF) 0.5 % IJ SOLN
INTRAMUSCULAR | Status: AC
  Filled 2018-04-29: qty 30

## 2018-04-29 MED ORDER — PROPOFOL 500 MG/50ML IV EMUL
INTRAVENOUS | Status: DC | PRN
  Administered 2018-04-29: 50 ug/kg/min via INTRAVENOUS

## 2018-04-29 MED ORDER — IOPAMIDOL (ISOVUE-M 200) INJECTION 41%
INTRAMUSCULAR | Status: DC | PRN
  Administered 2018-04-29: 20 mL

## 2018-04-29 MED ORDER — FENTANYL CITRATE (PF) 100 MCG/2ML IJ SOLN
INTRAMUSCULAR | Status: DC | PRN
  Administered 2018-04-29: 50 ug via INTRAVENOUS

## 2018-04-29 MED ORDER — ONDANSETRON HCL 4 MG PO TABS: 4.0000 mg | ORAL_TABLET | Freq: Four times a day (QID) | ORAL | Status: DC | PRN

## 2018-04-29 MED ORDER — FENTANYL CITRATE (PF) 100 MCG/2ML IJ SOLN: 25.0000 ug | INTRAMUSCULAR | Status: DC | PRN

## 2018-04-29 SURGICAL SUPPLY — 22 items
ADH SKN CLS APL DERMABOND .7 (GAUZE/BANDAGES/DRESSINGS) ×1
CEMENT KYPHON CX01A KIT/MIXER (Cement) ×3 IMPLANT
COVER WAND RF STERILE (DRAPES) ×3 IMPLANT
DERMABOND ADVANCED (GAUZE/BANDAGES/DRESSINGS) ×2
DERMABOND ADVANCED .7 DNX12 (GAUZE/BANDAGES/DRESSINGS) ×1 IMPLANT
DEVICE BIOPSY BONE KYPH (INSTRUMENTS) ×4 IMPLANT
DEVICE BIOPSY BONE KYPHX (INSTRUMENTS) ×1 IMPLANT
DRAPE C-ARM XRAY 36X54 (DRAPES) ×3 IMPLANT
DURAPREP 26ML APPLICATOR (WOUND CARE) ×3 IMPLANT
FEE RENTAL RFA GENERATOR (MISCELLANEOUS) IMPLANT
GLOVE SURG SYN 9.0  PF PI (GLOVE) ×2
GLOVE SURG SYN 9.0 PF PI (GLOVE) ×1 IMPLANT
GOWN SRG 2XL LVL 4 RGLN SLV (GOWNS) ×1 IMPLANT
GOWN STRL NON-REIN 2XL LVL4 (GOWNS) ×3
GOWN STRL REUS W/ TWL LRG LVL3 (GOWN DISPOSABLE) ×1 IMPLANT
GOWN STRL REUS W/TWL LRG LVL3 (GOWN DISPOSABLE) ×3
PACK KYPHOPLASTY (MISCELLANEOUS) ×3 IMPLANT
RENTAL RFA  GENERATOR (MISCELLANEOUS)
RENTAL RFA GENERATOR (MISCELLANEOUS) IMPLANT
STRAP SAFETY 5IN WIDE (MISCELLANEOUS) ×3 IMPLANT
TRAY KYPHOPAK 15/3 EXPRESS 1ST (MISCELLANEOUS) ×3 IMPLANT
TRAY KYPHOPAK 20/3 EXPRESS 1ST (MISCELLANEOUS) ×1 IMPLANT

## 2018-04-29 NOTE — Progress Notes (Signed)
Inpatient Diabetes Program Recommendations  AACE/ADA: New Consensus Statement on Inpatient Glycemic Control (2015)  Target Ranges:  Prepandial:   less than 140 mg/dL      Peak postprandial:   less than 180 mg/dL (1-2 hours)      Critically ill patients:  140 - 180 mg/dL   Lab Results  Component Value Date   GLUCAP 226 (H) 04/29/2018   HGBA1C 6.7 (A) 09/05/2014    Review of Glycemic Control Results for TARALYNN, QUIETT (MRN [id number]) as of 04/29/2018 16:18  Ref. Range 04/29/2018 12:03 04/29/2018 14:31  Glucose-Capillary Latest Ref Range: 70 - 99 mg/dL 269 (H) 226 (H)   Diabetes history: DM 1.5/LADA Outpatient Diabetes medications: Toujeo 8 units daily at 12:30p, Novolog 3.5-5 units tid with meals Current orders for Inpatient glycemic control:  None- patient being d/c'd after procedure  Inpatient Diabetes Program Recommendations:    Spoke with RN and patient is getting ready to go home.  Asked RN to remind patient that she will need to take Toujeo when she gets home since she has not taken any today. RN also states that patient will resume Novolog once she starts eating when home.  Agree with plan.   Thanks,  Adah Perl, RN, BC-ADM Inpatient Diabetes Coordinator Pager [phone] (8a-5p)

## 2018-04-29 NOTE — H&P (Signed)
Reviewed paper H+P, will be scanned into chart. No changes noted.  

## 2018-04-29 NOTE — Discharge Instructions (Addendum)
AMBULATORY SURGERY  DISCHARGE INSTRUCTIONS   1) The drugs that you were given will stay in your system until tomorrow so for the next 24 hours you should not:  A) Drive an automobile B) Make any legal decisions C) Drink any alcoholic beverage   2) You may resume regular meals tomorrow.  Today it is better to start with liquids and gradually work up to solid foods.  You may eat anything you prefer, but it is better to start with liquids, then soup and crackers, and gradually work up to solid foods.   3) Please notify your doctor immediately if you have any unusual bleeding, trouble breathing, redness and pain at the surgery site, drainage, fever, or pain not relieved by medication.    4) Additional Instructions:    Please contact your physician with any problems or Same Day Surgery at [phone], Monday through Friday 6 am to 4 pm, or [place] at [facility] number at [phone] #5   .Take it easy today and tomorrow.  Resume more normal activities on Saturday as tolerated.  Remove Band-Aids on Saturday then okay to shower.  Call office if you are having problems  [phone]

## 2018-04-29 NOTE — Anesthesia Preprocedure Evaluation (Addendum)
Anesthesia Evaluation  Patient identified by MRN, date of birth, ID band Patient awake    Airway Mallampati: III       Dental   Pulmonary former smoker,    Pulmonary exam normal        Cardiovascular hypertension, Normal cardiovascular exam     Neuro/Psych  Neuromuscular disease CVA negative psych ROS   GI/Hepatic negative GI ROS, Neg liver ROS,   Endo/Other  diabetes  Renal/GU negative Renal ROS  negative genitourinary   Musculoskeletal  (+) Arthritis , Osteoarthritis,    Abdominal Normal abdominal exam  (+)   Peds negative pediatric ROS (+)  Hematology negative hematology ROS (+)   Anesthesia Other Findings Past Medical History: No date: Diabetes mellitus without complication (HCC) No date: Hyperlipidemia No date: Lichen sclerosus No date: Stroke [facility])  Reproductive/Obstetrics                             Anesthesia Physical Anesthesia Plan  ASA: III  Anesthesia Plan: MAC and General   Post-op Pain Management:    Induction: Intravenous  PONV Risk Score and Plan: Propofol infusion  Airway Management Planned: Nasal Cannula  Additional Equipment:   Intra-op Plan:   Post-operative Plan:   Informed Consent: I have reviewed the patients History and Physical, chart, labs and discussed the procedure including the risks, benefits and alternatives for the proposed anesthesia with the patient or authorized representative who has indicated his/her understanding and acceptance.     Dental advisory given  Plan Discussed with: CRNA and Surgeon  Anesthesia Plan Comments:         Anesthesia Quick Evaluation

## 2018-04-29 NOTE — Transfer of Care (Signed)
Immediate Anesthesia Transfer of Care Note  Patient: Denise Sherman  Procedure(s) Performed: KYPHOPLASTY L2, DIABETIC (N/A Spine Lumbar)  Patient Location: PACU  Anesthesia Type:MAC  Level of Consciousness: awake and drowsy  Airway & Oxygen Therapy: Patient Spontanous Breathing and Patient connected to nasal cannula oxygen  Post-op Assessment: Report given to RN and Post -op Vital signs reviewed and stable  Post vital signs: Reviewed and stable  Last Vitals:  Vitals Value Taken Time  BP 144/77 04/29/2018  2:21 PM  Temp 36.2 C 04/29/2018  2:21 PM  Pulse 67 04/29/2018  2:30 PM  Resp 14 04/29/2018  2:31 PM  SpO2 100 % 04/29/2018  2:30 PM  Vitals shown include unvalidated device data.  Last Pain:  Vitals:   04/29/18 1421  TempSrc:   PainSc: Asleep         Complications: No apparent anesthesia complications

## 2018-04-29 NOTE — Op Note (Signed)
Date April 29, 2018  time 2:21 PM   PATIENT:  Denise Sherman   PRE-OPERATIVE DIAGNOSIS:  closed wedge compression fracture of L2   POST-OPERATIVE DIAGNOSIS:  closed wedge compression fracture of L2   PROCEDURE:  Procedure(s): KYPHOPLASTY L2  SURGEON: Laurene Footman, MD   ASSISTANTS: None   ANESTHESIA:   local and MAC   EBL:  No intake/output data recorded.   BLOOD ADMINISTERED:none   DRAINS: none    LOCAL MEDICATIONS USED:  MARCAINE    and XYLOCAINE    SPECIMEN:   L2 vertebral body biopsy   DISPOSITION OF SPECIMEN:  Pathology   COUNTS:  YES   TOURNIQUET:  * No tourniquets in log *   IMPLANTS: Bone cement   DICTATION: .Dragon Dictation  patient was brought to the operating room and after adequate anesthesia was obtained the patient was placed prone.  C arm was brought in in good visualization of the affected level obtained on both AP and lateral projections.  After patient identification and timeout procedures were completed, local anesthetic was infiltrated with 10 cc 1% Xylocaine infiltrated subcutaneously.  This is done the area on both sides of the planned approach.  The back was then prepped and draped in the usual sterile manner and repeat timeout procedure carried out.  A spinal needle was brought down to the pedicle on the both sides of  L2 and a 50-50 mix of 1% Xylocaine half percent Sensorcaine with epinephrine total of 20 cc injected.  After allowing this to set a small incision was made and the trocar was advanced into the vertebral body in an extrapedicular fashion.  Biopsy was obtained Drilling was carried out balloon inserted with inflation to  4 cc.  Identical procedure then carried out on the left side without the biopsy.  4 cc of inflation on the left side as well. When the cement was appropriate consistency 8 cc were injected into the vertebral body without extravasation, good fill superior to inferior endplates and from right to left sides along the inferior  endplate.  After the cement had set the trochar was removed and permanent C-arm views obtained.  The wound was closed with Dermabond followed by Band-Aid   PLAN OF CARE: Discharge to home after PACU   PATIENT DISPOSITION:  PACU - hemodynamically stable.

## 2018-04-29 NOTE — Anesthesia Post-op Follow-up Note (Signed)
Anesthesia QCDR form completed.        

## 2018-04-30 ENCOUNTER — Encounter: Payer: Self-pay | Admitting: Orthopedic Surgery

## 2018-04-30 NOTE — Anesthesia Postprocedure Evaluation (Signed)
Anesthesia Post Note  Patient: Denise Sherman  Procedure(s) Performed: KYPHOPLASTY L2, DIABETIC (N/A Spine Lumbar)  Patient location during evaluation: PACU Anesthesia Type: MAC Level of consciousness: awake and alert and oriented Pain management: pain level controlled Vital Signs Assessment: post-procedure vital signs reviewed and stable Respiratory status: spontaneous breathing Cardiovascular status: blood pressure returned to baseline Anesthetic complications: no     Last Vitals:  Vitals:   04/29/18 1556 04/29/18 1606  BP: (!) 187/87 (!) 187/72  Pulse: (!) 49 (!) 49  Resp: 18 16  Temp:    SpO2: 99%     Last Pain:  Vitals:   04/30/18 1043  TempSrc:   PainSc: 0-No pain                 Shavonda Wiedman

## 2018-05-03 LAB — SURGICAL PATHOLOGY

## 2018-05-07 ENCOUNTER — Encounter: Payer: Self-pay | Admitting: Orthopedic Surgery

## 2018-05-07 DIAGNOSIS — M545 Low back pain: Secondary | ICD-10-CM | POA: Diagnosis not present

## 2018-05-14 DIAGNOSIS — S32020D Wedge compression fracture of second lumbar vertebra, subsequent encounter for fracture with routine healing: Secondary | ICD-10-CM | POA: Diagnosis not present

## 2018-06-23 DIAGNOSIS — M48062 Spinal stenosis, lumbar region with neurogenic claudication: Secondary | ICD-10-CM | POA: Diagnosis not present

## 2018-06-23 DIAGNOSIS — M5136 Other intervertebral disc degeneration, lumbar region: Secondary | ICD-10-CM | POA: Diagnosis not present

## 2018-06-23 DIAGNOSIS — M5416 Radiculopathy, lumbar region: Secondary | ICD-10-CM | POA: Diagnosis not present

## 2018-07-02 DIAGNOSIS — Z79899 Other long term (current) drug therapy: Secondary | ICD-10-CM | POA: Diagnosis not present

## 2018-07-02 DIAGNOSIS — E109 Type 1 diabetes mellitus without complications: Secondary | ICD-10-CM | POA: Diagnosis not present

## 2018-07-05 DIAGNOSIS — Z79899 Other long term (current) drug therapy: Secondary | ICD-10-CM | POA: Diagnosis not present

## 2018-07-05 DIAGNOSIS — Z0000 Encounter for general adult medical examination without abnormal findings: Secondary | ICD-10-CM | POA: Diagnosis not present

## 2018-07-16 ENCOUNTER — Other Ambulatory Visit: Payer: Self-pay | Admitting: *Deleted

## 2018-07-16 NOTE — Patient Outreach (Signed)
[facility]) Care Management  07/16/2018  TANEQUA KRETZ 1940-08-19 [id number]   Telephone Screen  Referral Date:07/15/18 Referral Source:  Nurse call center Referral Reason: caller states she has a ridge in her jaw that she keeps biting. Not bleeding, but has been there for ove a month. Saw her pcp for wellness check and he was not concerned. Told her that she probably has bitten it as continues to re-bite it. She states that it is sore. She often wakes up at night from biting it Goes from her throat to where her molars begin PMH  DM, recent surgery post fall - Rhonda B, RN advised to see pcp, mouth care, mouth guard at hs to prevent biting cheekduring sleep Insurance: [employer] ([employer])     Outreach attempt # 1 successful to the home number  Patient is able to verify HIPAA Reviewed and addressed referral to [facility] with patient  Mrs Bacigalupo confirms the soreness of her ridge in her jaw has been occurring for 2 months. She reports attempts to prevent from re biting it as MD recommended was unsuccessful. She states she hs been able to go out in the community at this time to get the recommended mouth guard but will do so as soon as she can. She reports upcoming appointments with 2 different dentist to see if issue can be resolved. Cm discussed over bites and encouraged a discussion with one of the dentists.  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 Patient gave verbal consent for services [facility] telephonic RN CM.   Plan: [facility] RN CM will close case at this time as patient has been assessed and no needs identified/needs resolved.   Pt encouraged to return a call to [place] CM prn  [facility] RN CM sent a successful outreach letter as discussed with [facility] brochure enclosed for review  Route note to MD  Joelene Millin L. Lavina Hamman, RN, BSN, [place] Coordinator Office number [phone] Mobile number [phone]  Main [facility] number [phone] Fax number [phone]

## 2018-07-21 DIAGNOSIS — E109 Type 1 diabetes mellitus without complications: Secondary | ICD-10-CM | POA: Diagnosis not present

## 2018-07-21 DIAGNOSIS — M810 Age-related osteoporosis without current pathological fracture: Secondary | ICD-10-CM | POA: Diagnosis not present

## 2018-07-27 DIAGNOSIS — M810 Age-related osteoporosis without current pathological fracture: Secondary | ICD-10-CM | POA: Diagnosis not present

## 2018-08-02 DIAGNOSIS — M5136 Other intervertebral disc degeneration, lumbar region: Secondary | ICD-10-CM | POA: Diagnosis not present

## 2018-08-02 DIAGNOSIS — M48062 Spinal stenosis, lumbar region with neurogenic claudication: Secondary | ICD-10-CM | POA: Diagnosis not present

## 2018-08-02 DIAGNOSIS — M5416 Radiculopathy, lumbar region: Secondary | ICD-10-CM | POA: Diagnosis not present

## 2018-08-09 DIAGNOSIS — M5416 Radiculopathy, lumbar region: Secondary | ICD-10-CM | POA: Diagnosis not present

## 2018-08-09 DIAGNOSIS — M48062 Spinal stenosis, lumbar region with neurogenic claudication: Secondary | ICD-10-CM | POA: Diagnosis not present

## 2018-08-09 DIAGNOSIS — M5136 Other intervertebral disc degeneration, lumbar region: Secondary | ICD-10-CM | POA: Diagnosis not present

## 2018-08-13 DIAGNOSIS — M545 Low back pain: Secondary | ICD-10-CM | POA: Diagnosis not present

## 2018-08-13 DIAGNOSIS — R269 Unspecified abnormalities of gait and mobility: Secondary | ICD-10-CM | POA: Diagnosis not present

## 2018-08-13 DIAGNOSIS — M79605 Pain in left leg: Secondary | ICD-10-CM | POA: Diagnosis not present

## 2018-08-13 DIAGNOSIS — M4147 Neuromuscular scoliosis, lumbosacral region: Secondary | ICD-10-CM | POA: Diagnosis not present

## 2018-08-13 DIAGNOSIS — M4807 Spinal stenosis, lumbosacral region: Secondary | ICD-10-CM | POA: Diagnosis not present

## 2018-08-19 DIAGNOSIS — M810 Age-related osteoporosis without current pathological fracture: Secondary | ICD-10-CM | POA: Diagnosis not present

## 2018-08-20 ENCOUNTER — Other Ambulatory Visit: Payer: Self-pay | Admitting: Family Medicine

## 2018-08-20 DIAGNOSIS — Z1231 Encounter for screening mammogram for malignant neoplasm of breast: Secondary | ICD-10-CM

## 2018-09-10 DIAGNOSIS — M4316 Spondylolisthesis, lumbar region: Secondary | ICD-10-CM | POA: Diagnosis not present

## 2018-09-10 DIAGNOSIS — I10 Essential (primary) hypertension: Secondary | ICD-10-CM | POA: Diagnosis not present

## 2018-09-10 DIAGNOSIS — M48062 Spinal stenosis, lumbar region with neurogenic claudication: Secondary | ICD-10-CM | POA: Diagnosis not present

## 2018-09-10 DIAGNOSIS — S32020S Wedge compression fracture of second lumbar vertebra, sequela: Secondary | ICD-10-CM | POA: Diagnosis not present

## 2018-09-16 ENCOUNTER — Other Ambulatory Visit: Payer: Self-pay | Admitting: Neurosurgery

## 2018-09-16 ENCOUNTER — Other Ambulatory Visit (HOSPITAL_COMMUNITY): Payer: Self-pay | Admitting: Neurosurgery

## 2018-09-16 DIAGNOSIS — S32020S Wedge compression fracture of second lumbar vertebra, sequela: Secondary | ICD-10-CM

## 2018-09-26 ENCOUNTER — Ambulatory Visit
Admission: RE | Admit: 2018-09-26 | Discharge: 2018-09-26 | Disposition: A | Discharge status: Home / Self Care | Payer: PPO | Source: Ambulatory Visit | Attending: Neurosurgery | Admitting: Neurosurgery

## 2018-09-26 ENCOUNTER — Other Ambulatory Visit: Payer: Self-pay

## 2018-09-26 DIAGNOSIS — S3992XA Unspecified injury of lower back, initial encounter: Secondary | ICD-10-CM | POA: Diagnosis not present

## 2018-09-26 DIAGNOSIS — S32020S Wedge compression fracture of second lumbar vertebra, sequela: Secondary | ICD-10-CM | POA: Diagnosis not present

## 2018-09-26 LAB — POCT I-STAT CREATININE: Creatinine, Ser: 0.8 mg/dL (ref 0.44–1.00)

## 2018-09-26 MED ORDER — GADOBUTROL 1 MMOL/ML IV SOLN
5.0000 mL | Freq: Once | INTRAVENOUS | Status: AC | PRN
  Administered 2018-09-26: 5 mL via INTRAVENOUS

## 2018-09-28 DIAGNOSIS — M48062 Spinal stenosis, lumbar region with neurogenic claudication: Secondary | ICD-10-CM | POA: Diagnosis not present

## 2018-09-28 DIAGNOSIS — I10 Essential (primary) hypertension: Secondary | ICD-10-CM | POA: Diagnosis not present

## 2018-09-30 ENCOUNTER — Ambulatory Visit
Admission: RE | Admit: 2018-09-30 | Discharge: 2018-09-30 | Disposition: A | Discharge status: Home / Self Care | Payer: PPO | Source: Ambulatory Visit | Attending: Family Medicine | Admitting: Family Medicine

## 2018-09-30 DIAGNOSIS — Z1231 Encounter for screening mammogram for malignant neoplasm of breast: Secondary | ICD-10-CM | POA: Insufficient documentation

## 2018-10-12 ENCOUNTER — Other Ambulatory Visit: Payer: Self-pay | Admitting: Neurosurgery

## 2018-10-21 DIAGNOSIS — L570 Actinic keratosis: Secondary | ICD-10-CM | POA: Diagnosis not present

## 2018-10-21 DIAGNOSIS — D485 Neoplasm of uncertain behavior of skin: Secondary | ICD-10-CM | POA: Diagnosis not present

## 2018-10-21 DIAGNOSIS — L578 Other skin changes due to chronic exposure to nonionizing radiation: Secondary | ICD-10-CM | POA: Diagnosis not present

## 2018-10-21 DIAGNOSIS — C4491 Basal cell carcinoma of skin, unspecified: Secondary | ICD-10-CM

## 2018-10-21 DIAGNOSIS — C44319 Basal cell carcinoma of skin of other parts of face: Secondary | ICD-10-CM | POA: Diagnosis not present

## 2018-10-21 HISTORY — DX: Basal cell carcinoma of skin, unspecified: C44.91

## 2018-10-22 ENCOUNTER — Other Ambulatory Visit: Payer: Self-pay | Admitting: Neurosurgery

## 2018-10-26 DIAGNOSIS — I10 Essential (primary) hypertension: Secondary | ICD-10-CM | POA: Diagnosis not present

## 2018-11-02 DIAGNOSIS — E119 Type 2 diabetes mellitus without complications: Secondary | ICD-10-CM | POA: Diagnosis not present

## 2018-11-04 DIAGNOSIS — L578 Other skin changes due to chronic exposure to nonionizing radiation: Secondary | ICD-10-CM | POA: Diagnosis not present

## 2018-11-04 DIAGNOSIS — L570 Actinic keratosis: Secondary | ICD-10-CM | POA: Diagnosis not present

## 2018-11-04 DIAGNOSIS — C44319 Basal cell carcinoma of skin of other parts of face: Secondary | ICD-10-CM | POA: Diagnosis not present

## 2018-11-04 DIAGNOSIS — L98499 Non-pressure chronic ulcer of skin of other sites with unspecified severity: Secondary | ICD-10-CM | POA: Diagnosis not present

## 2018-11-05 NOTE — Progress Notes (Signed)
[id number] - [name], [place] [address] [place] [address] [place] [address] Phone: [phone] Fax: [phone]    Your procedure is scheduled on Wednesday, September 16th.  Report to [facility] Main Entrance "A" at 7:15 A.M., and check in at the Admitting office.  Call this number if you have problems the morning of surgery:  [phone]  Call [phone] if you have any questions prior to your surgery date Monday-Friday 8am-4pm   Remember:  Do not eat or drink after midnight the night before your surgery   Take these medicines the morning of surgery with A SIP OF WATER  amLODipine (NORVASC) gabapentin (NEURONTIN metoprolol tartrate (LOPRESSOR)   7 days prior to surgery STOP taking any Aspirin (unless otherwise instructed by your surgeon), Aleve, Naproxen, Ibuprofen, Motrin, Advil, Goody's, BC's, all herbal medications, fish oil, and all vitamins.   How to Manage Your Diabetes  WHAT DO I DO ABOUT MY DIABETES MEDICATION?  Marland Kitchen Do not take oral diabetes medicines (pills) the morning of surgery.  . THE NIGHT BEFORE SURGERY,take 3 units of Insulin Glargine (TOUJEO SOLOSTAR) insulin.  . THE MORNING OF SURGERY, take 3 units of Insulin Glargine (TOUJEO SOLOSTAR) insulin.  Before and After Surgery  Why is it important to control my blood sugar before and after surgery? . Improving blood sugar levels before and after surgery helps healing and can limit problems. . A way of improving blood sugar control is eating a healthy diet by: o  Eating less sugar and carbohydrates o  Increasing activity/exercise o  Talking with your doctor about reaching your blood sugar goals . High blood sugars (greater than 180 mg/dL) can raise your risk of infections and slow your recovery, so you will need to focus on controlling your diabetes during the weeks before surgery. . Make sure that the doctor who takes care of  your diabetes knows about your planned surgery including the date and location.  How do I manage my blood sugar before surgery? . Check your blood sugar at least 4 times a day, starting 2 days before surgery, to make sure that the level is not too high or low. o Check your blood sugar the morning of your surgery when you wake up and every 2 hours until you get to the Short Stay unit. . If your blood sugar is less than 70 mg/dL, you will need to treat for low blood sugar: o Do not take insulin. o Treat a low blood sugar (less than 70 mg/dL) with  cup of clear juice (cranberry or apple), 4 glucose tablets, OR glucose gel. Recheck blood sugar in 15 minutes after treatment (to make sure it is greater than 70 mg/dL). If your blood sugar is not greater than 70 mg/dL on recheck, call [phone] o  for further instructions. . Report your blood sugar to the short stay nurse when you get to Short Stay.  . If you are admitted to the hospital after surgery: o Your blood sugar will be checked by the staff and you will probably be given insulin after surgery (instead of oral diabetes medicines) to make sure you have good blood sugar levels. o The goal for blood sugar control after surgery is 80-180 mg/dL.  . If your CBG is greater than 220 mg/dL, you may take  of your sliding scale (correction) dose of insulin.  Reviewed and Endorsed by [facility] Patient Education Committee, August 2015  The Morning of Surgery  Do not wear jewelry, make-up or nail polish.  Do not wear lotions, powders, or perfumes/colognes, or deodorant  Do not shave 48 hours prior to surgery.    Do not bring valuables to the hospital.  [facility] is not responsible for any belongings or valuables.  If you are a smoker, DO NOT Smoke 24 hours prior to surgery IF you wear a CPAP at night please bring your mask, tubing, and machine the morning of surgery   Remember that you must have someone to transport you home after your  surgery, and remain with you for 24 hours if you are discharged the same day.  Contacts, glasses, hearing aids, dentures or bridgework may not be worn into surgery.   Leave your suitcase in the car.  After surgery it may be brought to your room.  For patients admitted to the hospital, discharge time will be determined by your treatment team.  Patients discharged the day of surgery will not be allowed to drive home.   Special instructions:   [place]- Preparing For Surgery  Before surgery, you can play an important role. Because skin is not sterile, your skin needs to be as free of germs as possible. You can reduce the number of germs on your skin by washing with CHG (chlorahexidine gluconate) Soap before surgery.  CHG is an antiseptic cleaner which kills germs and bonds with the skin to continue killing germs even after washing.    Oral Hygiene is also important to reduce your risk of infection.  Remember - BRUSH YOUR TEETH THE MORNING OF SURGERY WITH YOUR REGULAR TOOTHPASTE  Please do not use if you have an allergy to CHG or antibacterial soaps. If your skin becomes reddened/irritated stop using the CHG.  Do not shave (including legs and underarms) for at least 48 hours prior to first CHG shower. It is OK to shave your face.  Please follow these instructions carefully.   1. Shower the NIGHT BEFORE SURGERY and the MORNING OF SURGERY with CHG Soap.   2. If you chose to wash your hair, wash your hair first as usual with your normal shampoo.  3. After you shampoo, rinse your hair and body thoroughly to remove the shampoo.  4. Use CHG as you would any other liquid soap. You can apply CHG directly to the skin and wash gently with a scrungie or a clean washcloth.   5. Apply the CHG Soap to your body ONLY FROM THE NECK DOWN.  Do not use on open wounds or open sores. Avoid contact with your eyes, ears, mouth and genitals (private parts). Wash Face and genitals (private parts)  with your  normal soap.   6. Wash thoroughly, paying special attention to the area where your surgery will be performed.  7. Thoroughly rinse your body with warm water from the neck down.  8. DO NOT shower/wash with your normal soap after using and rinsing off the CHG Soap.  9. Pat yourself dry with a CLEAN TOWEL.  10. Wear CLEAN PAJAMAS to bed the night before surgery, wear comfortable clothes the morning of surgery  11. Place CLEAN SHEETS on your bed the night of your first shower and DO NOT SLEEP WITH PETS.  Day of Surgery: Do not apply any deodorants/lotions. Please shower the morning of surgery with the CHG soap  Please wear clean clothes to the hospital/surgery center.   Remember to brush your teeth WITH YOUR REGULAR TOOTHPASTE.  Please read  over the following fact sheets that you were given.

## 2018-11-08 ENCOUNTER — Encounter (HOSPITAL_COMMUNITY): Payer: Self-pay

## 2018-11-08 ENCOUNTER — Other Ambulatory Visit: Payer: Self-pay

## 2018-11-08 ENCOUNTER — Other Ambulatory Visit (HOSPITAL_COMMUNITY)
Admission: RE | Admit: 2018-11-08 | Discharge: 2018-11-08 | Disposition: A | Discharge status: Home / Self Care | Payer: PPO | Source: Ambulatory Visit | Attending: Neurosurgery | Admitting: Neurosurgery

## 2018-11-08 ENCOUNTER — Encounter (HOSPITAL_COMMUNITY)
Admission: RE | Admit: 2018-11-08 | Discharge: 2018-11-08 | Disposition: A | Discharge status: Home / Self Care | Payer: PPO | Source: Ambulatory Visit | Attending: Neurosurgery | Admitting: Neurosurgery

## 2018-11-08 DIAGNOSIS — Z01812 Encounter for preprocedural laboratory examination: Secondary | ICD-10-CM | POA: Insufficient documentation

## 2018-11-08 DIAGNOSIS — M48062 Spinal stenosis, lumbar region with neurogenic claudication: Secondary | ICD-10-CM | POA: Diagnosis not present

## 2018-11-08 HISTORY — DX: Unspecified osteoarthritis, unspecified site: M19.90

## 2018-11-08 HISTORY — DX: Essential (primary) hypertension: I10

## 2018-11-08 HISTORY — DX: Malignant (primary) neoplasm, unspecified: C80.1

## 2018-11-08 LAB — BASIC METABOLIC PANEL
Anion gap: 10 (ref 5–15)
BUN: 16 mg/dL (ref 8–23)
CO2: 29 mmol/L (ref 22–32)
Calcium: 9.6 mg/dL (ref 8.9–10.3)
Chloride: 97 mmol/L — ABNORMAL LOW (ref 98–111)
Creatinine, Ser: 0.62 mg/dL (ref 0.44–1.00)
GFR calc Af Amer: >60 mL/min (ref >60)
GFR calc non Af Amer: >60 mL/min (ref >60)
Glucose, Bld: 100 mg/dL — ABNORMAL HIGH (ref 70–99)
Potassium: 4.3 mmol/L (ref 3.5–5.1)
Sodium: 136 mmol/L (ref 135–145)

## 2018-11-08 LAB — CBC
HCT: 47.4 % — ABNORMAL HIGH (ref 36.0–46.0)
Hemoglobin: 15.4 g/dL — ABNORMAL HIGH (ref 12.0–15.0)
MCH: 30.9 pg (ref 26.0–34.0)
MCHC: 32.5 g/dL (ref 30.0–36.0)
MCV: 95 fL (ref 80.0–100.0)
Platelets: 269 10*3/uL (ref 150–400)
RBC: 4.99 MIL/uL (ref 3.87–5.11)
RDW: 13.4 % (ref 11.5–15.5)
WBC: 6.3 10*3/uL (ref 4.0–10.5)
nRBC: 0 % (ref 0.0–0.2)

## 2018-11-08 LAB — SURGICAL PCR SCREEN
MRSA, PCR: NEGATIVE
Staphylococcus aureus: NEGATIVE

## 2018-11-08 LAB — SARS CORONAVIRUS 2 (TAT 6-24 HRS): SARS Coronavirus 2: NEGATIVE

## 2018-11-08 LAB — GLUCOSE, CAPILLARY: Glucose-Capillary: 116 mg/dL — ABNORMAL HIGH (ref 70–99)

## 2018-11-08 NOTE — Progress Notes (Addendum)
Your procedure is scheduled on Wednesday, September 16th.  Report to [facility] Main Entrance "A" at 7:15 A.M., and check in at the Admitting office.  Call this number if you have problems the morning of surgery:  [phone]  Call [phone] if you have any questions prior to your surgery date Monday-Friday 8am-4pm   Remember:  Do not eat or drink after midnight the night before your surgery   Take these medicines the morning of surgery with A SIP OF WATER  amLODipine (NORVASC) gabapentin (NEURONTIN metoprolol tartrate (LOPRESSOR)   Take if needed: Tylenol Tramadol  7 days prior to surgery STOP taking any Aspirin (unless otherwise instructed by your surgeon), Aleve, Naproxen, Ibuprofen, Motrin, Advil, Goody's, BC's, all herbal medications, fish oil, and all vitamins.   How to Manage Your Diabetes  WHAT DO I DO ABOUT MY DIABETES MEDICATION?  Marland Kitchen Do not take oral diabetes medicines (pills) the morning of surgery.  . THE NIGHT BEFORE SURGERY,take 3 units of Insulin Glargine (TOUJEO SOLOSTAR) insulin.  . THE MORNING OF SURGERY, take 3 units of Insulin Glargine (TOUJEO SOLOSTAR) insulin. If CBG is greater than 70.  Before and After Surgery  Why is it important to control my blood sugar before and after surgery? . Improving blood sugar levels before and after surgery helps healing and can limit problems. . A way of improving blood sugar control is eating a healthy diet by: o  Eating less sugar and carbohydrates o  Increasing activity/exercise o  Talking with your doctor about reaching your blood sugar goals . High blood sugars (greater than 180 mg/dL) can raise your risk of infections and slow your recovery, so you will need to focus on controlling your diabetes during the weeks before surgery. . Make sure that the doctor who takes care of your diabetes knows about your planned surgery including the date and location.  How do I manage my blood sugar before surgery? . Check  your blood sugar at least 4 times a day, starting 2 days before surgery, to make sure that the level is not too high or low. o Check your blood sugar the morning of your surgery when you wake up and every 2 hours until you get to the Short Stay unit. . If your blood sugar is less than 70 mg/dL, you will need to treat for low blood sugar: o Do not take insulin. o Treat a low blood sugar (less than 70 mg/dL) with  cup of clear juice (cranberry or apple), 4 glucose tablets, OR glucose gel. Recheck blood sugar in 15 minutes after treatment (to make sure it is greater than 70 mg/dL). If your blood sugar is not greater than 70 mg/dL on recheck, call [phone] o  for further instructions. . Report your blood sugar to the short stay nurse when you get to Short Stay.  . If you are admitted to the hospital after surgery: o Your blood sugar will be checked by the staff and you will probably be given insulin after surgery (instead of oral diabetes medicines) to make sure you have good blood sugar levels. o The goal for blood sugar control after surgery is 80-180 mg/dL.  . If your CBG is greater than 220 mg/dL, you may take  of your sliding scale (correction) dose of insulin.    Special instructions:   [place]- Preparing For Surgery  Before surgery, you can play an important role. Because skin is not sterile, your skin needs to be as free of  germs as possible. You can reduce the number of germs on your skin by washing with CHG (chlorahexidine gluconate) Soap before surgery.  CHG is an antiseptic cleaner which kills germs and bonds with the skin to continue killing germs even after washing.    Oral Hygiene is also important to reduce your risk of infection.  Remember - BRUSH YOUR TEETH THE MORNING OF SURGERY WITH YOUR REGULAR TOOTHPASTE  Please do not use if you have an allergy to CHG or antibacterial soaps. If your skin becomes reddened/irritated stop using the CHG.  Do not shave (including legs  and underarms) for at least 48 hours prior to first CHG shower. It is OK to shave your face.  Please follow these instructions carefully.   1. Shower the NIGHT BEFORE SURGERY and the MORNING OF SURGERY with CHG Soap.   2. If you chose to wash your hair, wash your hair first as usual with your normal shampoo.  After you shampoo, wash your face and private area with the soap you use at home, then rinse your hair and body thoroughly to remove the shampoo and soap.  3. Use CHG as you would any other liquid soap. You can apply CHG directly to the skin and wash gently with a scrungie or a clean washcloth.   Apply the CHG Soap to your body ONLY FROM THE NECK DOWN.  Do not use on open wounds or open sores. Avoid contact with your eyes, ears, mouth and genitals (private parts).   4. Wash thoroughly, paying special attention to the area where your surgery will be performed.  5. Thoroughly rinse your body with warm water from the neck down.  6. DO NOT shower/wash with your normal soap after using and rinsing off the CHG Soap.  7. Pat yourself dry with a CLEAN TOWEL.  8. Wear CLEAN PAJAMAS to bed the night before surgery, wear comfortable clothes the morning of surgery  9. Place CLEAN SHEETS on your bed the night of your first shower and DO NOT SLEEP WITH PETS.  Day of Surgery:  Shower as instructed above. Do not wear lotions, powders, or perfumes/colognes, or deodorant  Please wear clean clothes to the hospital/surgery center.   Remember to brush your teeth WITH YOUR REGULAR TOOTHPASTE.   Do not wear jewelry, make-up or nail polish.  Do not shave 48 hours prior to surgery.    Do not bring valuables to the hospital.  [facility] is not responsible for any belongings or valuables.  Contacts, glasses, hearing aids, dentures or bridgework may not be worn into surgery.   Leave your suitcase in the car.  After surgery it may be brought to your room.  For patients admitted to the hospital,  discharge time will be determined by your treatment team.  Patients discharged the day of surgery will not be allowed to drive home.  Please read over the following fact sheets that you were given.

## 2018-11-08 NOTE — Progress Notes (Addendum)
PCP - Dr Vernard Gambles  Endocrinologist - Dr. Mee Hives  Cardiologist - no  Chest x-ray - na  EKG - 04/29/2018  Stress Test - no  ECHO - no  Cardiac Cath - no  SIeep Study - no CPAP - no  LABS- CBC, BMP  ASA-no  ERAS-no  HA1C- 11/08/2018 Fasting Blood Sugar - 120's Checks Blood Sugar ___5__ times a day- has a mete, just is not wearing it this week due to surgery  Anesthesia-  Pt denies having chest pain, sob, or fever at this time. All instructions explained to the pt, with a verbal understanding of the material. Pt agrees to go over the instructions while at home for a better understanding. Pt also instructed to self quarantine after being tested for COVID-19. The opportunity to ask questions was provided.

## 2018-11-09 LAB — HEMOGLOBIN A1C
Hgb A1c MFr Bld: 6.6 % — ABNORMAL HIGH (ref 4.8–5.6)
Mean Plasma Glucose: 143 mg/dL

## 2018-11-10 ENCOUNTER — Ambulatory Visit (HOSPITAL_COMMUNITY): Payer: PPO

## 2018-11-10 ENCOUNTER — Ambulatory Visit (HOSPITAL_COMMUNITY): Payer: PPO | Admitting: Vascular Surgery

## 2018-11-10 ENCOUNTER — Ambulatory Visit (HOSPITAL_COMMUNITY): Payer: PPO | Admitting: Anesthesiology

## 2018-11-10 ENCOUNTER — Ambulatory Visit (HOSPITAL_COMMUNITY)
Admission: RE | Disposition: A | Discharge status: Home / Self Care | Payer: Self-pay | Source: Home / Self Care | Attending: Neurosurgery

## 2018-11-10 ENCOUNTER — Encounter (HOSPITAL_COMMUNITY): Payer: Self-pay

## 2018-11-10 ENCOUNTER — Other Ambulatory Visit: Payer: Self-pay

## 2018-11-10 ENCOUNTER — Ambulatory Visit (HOSPITAL_COMMUNITY)
Admission: RE | Admit: 2018-11-10 | Discharge: 2018-11-11 | Disposition: A | Discharge status: Home / Self Care | Payer: PPO | Attending: Neurosurgery | Admitting: Neurosurgery

## 2018-11-10 DIAGNOSIS — Z981 Arthrodesis status: Secondary | ICD-10-CM | POA: Diagnosis not present

## 2018-11-10 DIAGNOSIS — Z794 Long term (current) use of insulin: Secondary | ICD-10-CM | POA: Insufficient documentation

## 2018-11-10 DIAGNOSIS — Z8673 Personal history of transient ischemic attack (TIA), and cerebral infarction without residual deficits: Secondary | ICD-10-CM | POA: Insufficient documentation

## 2018-11-10 DIAGNOSIS — I10 Essential (primary) hypertension: Secondary | ICD-10-CM | POA: Diagnosis not present

## 2018-11-10 DIAGNOSIS — M48062 Spinal stenosis, lumbar region with neurogenic claudication: Secondary | ICD-10-CM | POA: Insufficient documentation

## 2018-11-10 DIAGNOSIS — M5416 Radiculopathy, lumbar region: Secondary | ICD-10-CM | POA: Insufficient documentation

## 2018-11-10 DIAGNOSIS — Z87891 Personal history of nicotine dependence: Secondary | ICD-10-CM | POA: Diagnosis not present

## 2018-11-10 DIAGNOSIS — Z419 Encounter for procedure for purposes other than remedying health state, unspecified: Secondary | ICD-10-CM

## 2018-11-10 DIAGNOSIS — Z85828 Personal history of other malignant neoplasm of skin: Secondary | ICD-10-CM | POA: Insufficient documentation

## 2018-11-10 DIAGNOSIS — Z79899 Other long term (current) drug therapy: Secondary | ICD-10-CM | POA: Diagnosis not present

## 2018-11-10 DIAGNOSIS — E7800 Pure hypercholesterolemia, unspecified: Secondary | ICD-10-CM | POA: Diagnosis not present

## 2018-11-10 DIAGNOSIS — E119 Type 2 diabetes mellitus without complications: Secondary | ICD-10-CM | POA: Insufficient documentation

## 2018-11-10 HISTORY — PX: LUMBAR LAMINECTOMY/DECOMPRESSION MICRODISCECTOMY: SHX5026

## 2018-11-10 LAB — GLUCOSE, CAPILLARY
Glucose-Capillary: 156 mg/dL — ABNORMAL HIGH (ref 70–99)
Glucose-Capillary: 229 mg/dL — ABNORMAL HIGH (ref 70–99)
Glucose-Capillary: 290 mg/dL — ABNORMAL HIGH (ref 70–99)
Glucose-Capillary: 263 mg/dL — ABNORMAL HIGH (ref 70–99)

## 2018-11-10 SURGERY — LUMBAR LAMINECTOMY/DECOMPRESSION MICRODISCECTOMY 3 LEVELS
Anesthesia: General

## 2018-11-10 MED ORDER — THROMBIN 5000 UNITS EX SOLR
OROMUCOSAL | Status: DC | PRN
  Administered 2018-11-10: 09:00:00 5 mL via TOPICAL

## 2018-11-10 MED ORDER — BACITRACIN ZINC 500 UNIT/GM EX OINT
TOPICAL_OINTMENT | CUTANEOUS | Status: AC
  Filled 2018-11-10: qty 28.35

## 2018-11-10 MED ORDER — FENTANYL CITRATE (PF) 100 MCG/2ML IJ SOLN
INTRAMUSCULAR | Status: DC | PRN
  Administered 2018-11-10 (×2): 100 ug via INTRAVENOUS
  Administered 2018-11-10 (×2): 50 ug via INTRAVENOUS
  Administered 2018-11-10: 100 ug via INTRAVENOUS

## 2018-11-10 MED ORDER — PROPOFOL 10 MG/ML IV BOLUS
INTRAVENOUS | Status: DC | PRN
  Administered 2018-11-10: 120 mg via INTRAVENOUS

## 2018-11-10 MED ORDER — PROPOFOL 10 MG/ML IV BOLUS
INTRAVENOUS | Status: AC
  Filled 2018-11-10: qty 20

## 2018-11-10 MED ORDER — LACTATED RINGERS IV SOLN
INTRAVENOUS | Status: DC | PRN
  Administered 2018-11-10: 09:00:00 via INTRAVENOUS

## 2018-11-10 MED ORDER — ROCURONIUM BROMIDE 100 MG/10ML IV SOLN
INTRAVENOUS | Status: DC | PRN
  Administered 2018-11-10: 60 mg via INTRAVENOUS

## 2018-11-10 MED ORDER — FENTANYL CITRATE (PF) 250 MCG/5ML IJ SOLN
INTRAMUSCULAR | Status: AC
  Filled 2018-11-10: qty 5

## 2018-11-10 MED ORDER — PHENOL 1.4 % MT LIQD: 1.0000 | OROMUCOSAL | Status: DC | PRN

## 2018-11-10 MED ORDER — FENTANYL CITRATE (PF) 100 MCG/2ML IJ SOLN
INTRAMUSCULAR | Status: AC
  Filled 2018-11-10: qty 2

## 2018-11-10 MED ORDER — GABAPENTIN 100 MG PO CAPS
100.0000 mg | ORAL_CAPSULE | Freq: Three times a day (TID) | ORAL | Status: DC
  Administered 2018-11-10 – 2018-11-11 (×3): 100 mg via ORAL
  Filled 2018-11-10 (×3): qty 1

## 2018-11-10 MED ORDER — ACETAMINOPHEN 500 MG PO TABS
1000.0000 mg | ORAL_TABLET | Freq: Four times a day (QID) | ORAL | Status: AC
  Administered 2018-11-10 – 2018-11-11 (×4): 1000 mg via ORAL
  Filled 2018-11-10 (×4): qty 2

## 2018-11-10 MED ORDER — BUPIVACAINE LIPOSOME 1.3 % IJ SUSP
20.0000 mL | INTRAMUSCULAR | Status: AC
  Administered 2018-11-10: 11:00:00 20 mL
  Filled 2018-11-10: qty 20

## 2018-11-10 MED ORDER — ACETAMINOPHEN 325 MG PO TABS: 650.0000 mg | ORAL_TABLET | ORAL | Status: DC | PRN

## 2018-11-10 MED ORDER — SODIUM CHLORIDE 0.9 % IV SOLN
INTRAVENOUS | Status: DC | PRN
  Administered 2018-11-10: 09:00:00 500 mL

## 2018-11-10 MED ORDER — OXYCODONE HCL 5 MG PO TABS: 5.0000 mg | ORAL_TABLET | Freq: Once | ORAL | Status: DC | PRN

## 2018-11-10 MED ORDER — INSULIN ASPART 100 UNIT/ML ~~LOC~~ SOLN
0.0000 [IU] | Freq: Three times a day (TID) | SUBCUTANEOUS | Status: DC
  Administered 2018-11-11: 08:00:00 11 [IU] via SUBCUTANEOUS

## 2018-11-10 MED ORDER — CEFAZOLIN SODIUM-DEXTROSE 2-4 GM/100ML-% IV SOLN
2.0000 g | Freq: Three times a day (TID) | INTRAVENOUS | Status: AC
  Administered 2018-11-10 (×2): 2 g via INTRAVENOUS
  Filled 2018-11-10 (×2): qty 100

## 2018-11-10 MED ORDER — HEMOSTATIC AGENTS (NO CHARGE) OPTIME
TOPICAL | Status: DC | PRN
  Administered 2018-11-10: 1 via TOPICAL

## 2018-11-10 MED ORDER — BUPIVACAINE-EPINEPHRINE (PF) 0.5% -1:200000 IJ SOLN
INTRAMUSCULAR | Status: AC
  Filled 2018-11-10: qty 30

## 2018-11-10 MED ORDER — BACITRACIN ZINC 500 UNIT/GM EX OINT
TOPICAL_OINTMENT | CUTANEOUS | Status: DC | PRN
  Administered 2018-11-10: 1 via TOPICAL

## 2018-11-10 MED ORDER — CYCLOBENZAPRINE HCL 10 MG PO TABS: 10.0000 mg | ORAL_TABLET | Freq: Three times a day (TID) | ORAL | Status: DC | PRN

## 2018-11-10 MED ORDER — ONDANSETRON HCL 4 MG/2ML IJ SOLN: 4.0000 mg | Freq: Four times a day (QID) | INTRAMUSCULAR | Status: DC | PRN

## 2018-11-10 MED ORDER — TRAMADOL HCL 50 MG PO TABS
50.0000 mg | ORAL_TABLET | Freq: Four times a day (QID) | ORAL | Status: DC | PRN
  Administered 2018-11-10 – 2018-11-11 (×2): 50 mg via ORAL
  Filled 2018-11-10 (×2): qty 1

## 2018-11-10 MED ORDER — OXYCODONE HCL 5 MG PO TABS: 5.0000 mg | ORAL_TABLET | ORAL | Status: DC | PRN

## 2018-11-10 MED ORDER — CEFAZOLIN SODIUM-DEXTROSE 2-4 GM/100ML-% IV SOLN
2.0000 g | INTRAVENOUS | Status: AC
  Administered 2018-11-10: 2 g via INTRAVENOUS
  Filled 2018-11-10: qty 100

## 2018-11-10 MED ORDER — THROMBIN 5000 UNITS EX SOLR
CUTANEOUS | Status: AC
  Filled 2018-11-10: qty 10000

## 2018-11-10 MED ORDER — THROMBIN 5000 UNITS EX SOLR
CUTANEOUS | Status: AC
  Filled 2018-11-10: qty 5000

## 2018-11-10 MED ORDER — EPHEDRINE SULFATE 50 MG/ML IJ SOLN
INTRAMUSCULAR | Status: DC | PRN
  Administered 2018-11-10 (×2): 10 mg via INTRAVENOUS

## 2018-11-10 MED ORDER — ONDANSETRON HCL 4 MG/2ML IJ SOLN
INTRAMUSCULAR | Status: AC
  Filled 2018-11-10: qty 2

## 2018-11-10 MED ORDER — INSULIN ASPART 100 UNIT/ML ~~LOC~~ SOLN: 0.0000 [IU] | Freq: Three times a day (TID) | SUBCUTANEOUS | Status: DC

## 2018-11-10 MED ORDER — SUCCINYLCHOLINE CHLORIDE 200 MG/10ML IV SOSY
PREFILLED_SYRINGE | INTRAVENOUS | Status: AC
  Filled 2018-11-10: qty 10

## 2018-11-10 MED ORDER — INSULIN ASPART 100 UNIT/ML ~~LOC~~ SOLN
0.0000 [IU] | Freq: Every day | SUBCUTANEOUS | Status: DC
  Administered 2018-11-10: 3 [IU] via SUBCUTANEOUS

## 2018-11-10 MED ORDER — AMLODIPINE BESYLATE 5 MG PO TABS
2.5000 mg | ORAL_TABLET | Freq: Every day | ORAL | Status: DC
  Administered 2018-11-11: 2.5 mg via ORAL
  Filled 2018-11-10: qty 1

## 2018-11-10 MED ORDER — SODIUM CHLORIDE 0.9 % IV SOLN
INTRAVENOUS | Status: DC | PRN
  Administered 2018-11-10: 40 ug/min via INTRAVENOUS

## 2018-11-10 MED ORDER — FENTANYL CITRATE (PF) 100 MCG/2ML IJ SOLN
25.0000 ug | INTRAMUSCULAR | Status: DC | PRN
  Administered 2018-11-10 (×2): 50 ug via INTRAVENOUS

## 2018-11-10 MED ORDER — ZOLPIDEM TARTRATE 5 MG PO TABS: 5.0000 mg | ORAL_TABLET | Freq: Every evening | ORAL | Status: DC | PRN

## 2018-11-10 MED ORDER — BISACODYL 10 MG RE SUPP: 10.0000 mg | Freq: Every day | RECTAL | Status: DC | PRN

## 2018-11-10 MED ORDER — DOCUSATE SODIUM 100 MG PO CAPS
100.0000 mg | ORAL_CAPSULE | Freq: Two times a day (BID) | ORAL | Status: DC
  Administered 2018-11-10 – 2018-11-11 (×3): 100 mg via ORAL
  Filled 2018-11-10 (×3): qty 1

## 2018-11-10 MED ORDER — LIDOCAINE HCL (CARDIAC) PF 100 MG/5ML IV SOSY
PREFILLED_SYRINGE | INTRAVENOUS | Status: DC | PRN
  Administered 2018-11-10: 60 mg via INTRAVENOUS

## 2018-11-10 MED ORDER — ONDANSETRON HCL 4 MG PO TABS: 4.0000 mg | ORAL_TABLET | Freq: Four times a day (QID) | ORAL | Status: DC | PRN

## 2018-11-10 MED ORDER — SODIUM CHLORIDE 0.9% FLUSH: 3.0000 mL | Freq: Two times a day (BID) | INTRAVENOUS | Status: DC

## 2018-11-10 MED ORDER — ACETAMINOPHEN 650 MG RE SUPP: 650.0000 mg | RECTAL | Status: DC | PRN

## 2018-11-10 MED ORDER — DEXAMETHASONE SODIUM PHOSPHATE 10 MG/ML IJ SOLN
INTRAMUSCULAR | Status: DC | PRN
  Administered 2018-11-10: 10 mg via INTRAVENOUS

## 2018-11-10 MED ORDER — CHLORHEXIDINE GLUCONATE CLOTH 2 % EX PADS: 6.0000 | MEDICATED_PAD | Freq: Once | CUTANEOUS | Status: DC

## 2018-11-10 MED ORDER — INSULIN ASPART 100 UNIT/ML ~~LOC~~ SOLN
0.0000 [IU] | SUBCUTANEOUS | Status: DC
  Administered 2018-11-10: 11 [IU] via SUBCUTANEOUS
  Administered 2018-11-10: 7 [IU] via SUBCUTANEOUS

## 2018-11-10 MED ORDER — SODIUM CHLORIDE 0.9% FLUSH: 3.0000 mL | INTRAVENOUS | Status: DC | PRN

## 2018-11-10 MED ORDER — OXYCODONE HCL 5 MG/5ML PO SOLN: 5.0000 mg | Freq: Once | ORAL | Status: DC | PRN

## 2018-11-10 MED ORDER — LIDOCAINE 2% (20 MG/ML) 5 ML SYRINGE
INTRAMUSCULAR | Status: AC
  Filled 2018-11-10: qty 5

## 2018-11-10 MED ORDER — OXYCODONE HCL 5 MG PO TABS: 10.0000 mg | ORAL_TABLET | ORAL | Status: DC | PRN

## 2018-11-10 MED ORDER — SUCCINYLCHOLINE CHLORIDE 20 MG/ML IJ SOLN
INTRAMUSCULAR | Status: DC | PRN
  Administered 2018-11-10: 100 mg via INTRAVENOUS

## 2018-11-10 MED ORDER — SUGAMMADEX SODIUM 200 MG/2ML IV SOLN
INTRAVENOUS | Status: DC | PRN
  Administered 2018-11-10: 200 mg via INTRAVENOUS

## 2018-11-10 MED ORDER — MORPHINE SULFATE (PF) 4 MG/ML IV SOLN: 4.0000 mg | INTRAVENOUS | Status: DC | PRN

## 2018-11-10 MED ORDER — SODIUM CHLORIDE 0.9 % IV SOLN: 250.0000 mL | INTRAVENOUS | Status: DC

## 2018-11-10 MED ORDER — LISINOPRIL 20 MG PO TABS
40.0000 mg | ORAL_TABLET | Freq: Every day | ORAL | Status: DC
  Administered 2018-11-10 – 2018-11-11 (×2): 40 mg via ORAL
  Filled 2018-11-10 (×2): qty 2

## 2018-11-10 MED ORDER — 0.9 % SODIUM CHLORIDE (POUR BTL) OPTIME
TOPICAL | Status: DC | PRN
  Administered 2018-11-10: 1000 mL

## 2018-11-10 MED ORDER — BUPIVACAINE-EPINEPHRINE (PF) 0.5% -1:200000 IJ SOLN
INTRAMUSCULAR | Status: DC | PRN
  Administered 2018-11-10: 10 mL via PERINEURAL

## 2018-11-10 MED ORDER — ONDANSETRON HCL 4 MG/2ML IJ SOLN
INTRAMUSCULAR | Status: DC | PRN
  Administered 2018-11-10: 4 mg via INTRAVENOUS

## 2018-11-10 MED ORDER — MENTHOL 3 MG MT LOZG: 1.0000 | LOZENGE | OROMUCOSAL | Status: DC | PRN

## 2018-11-10 MED ORDER — METOPROLOL TARTRATE 25 MG PO TABS
50.0000 mg | ORAL_TABLET | Freq: Two times a day (BID) | ORAL | Status: DC
  Administered 2018-11-10 – 2018-11-11 (×2): 50 mg via ORAL
  Filled 2018-11-10 (×2): qty 2

## 2018-11-10 SURGICAL SUPPLY — 53 items
ADH SKN CLS APL DERMABOND .7 (GAUZE/BANDAGES/DRESSINGS) ×1
APL SKNCLS STERI-STRIP NONHPOA (GAUZE/BANDAGES/DRESSINGS) ×1
BAG DECANTER FOR FLEXI CONT (MISCELLANEOUS) ×3 IMPLANT
BENZOIN TINCTURE PRP APPL 2/3 (GAUZE/BANDAGES/DRESSINGS) ×3 IMPLANT
BLADE CLIPPER SURG (BLADE) IMPLANT
BUR MATCHSTICK NEURO 3.0 LAGG (BURR) ×3 IMPLANT
BUR PRECISION FLUTE 6.0 (BURR) ×3 IMPLANT
CANISTER SUCT 3000ML PPV (MISCELLANEOUS) ×3 IMPLANT
CARTRIDGE OIL MAESTRO DRILL (MISCELLANEOUS) ×1 IMPLANT
CLOSURE WOUND 1/2 X4 (GAUZE/BANDAGES/DRESSINGS) ×1
COVER WAND RF STERILE (DRAPES) ×3 IMPLANT
DERMABOND ADVANCED (GAUZE/BANDAGES/DRESSINGS) ×2
DERMABOND ADVANCED .7 DNX12 (GAUZE/BANDAGES/DRESSINGS) IMPLANT
DIFFUSER DRILL AIR PNEUMATIC (MISCELLANEOUS) ×3 IMPLANT
DRAPE LAPAROTOMY 100X72X124 (DRAPES) ×3 IMPLANT
DRAPE MICROSCOPE LEICA (MISCELLANEOUS) ×3 IMPLANT
DRAPE POUCH INSTRU U-SHP 10X18 (DRAPES) ×1 IMPLANT
DRAPE SURG 17X23 STRL (DRAPES) ×12 IMPLANT
DRSG OPSITE POSTOP 4X6 (GAUZE/BANDAGES/DRESSINGS) ×2 IMPLANT
ELECT BLADE 4.0 EZ CLEAN MEGAD (MISCELLANEOUS) ×3
ELECT REM PT RETURN 9FT ADLT (ELECTROSURGICAL) ×3
ELECTRODE BLDE 4.0 EZ CLN MEGD (MISCELLANEOUS) ×1 IMPLANT
ELECTRODE REM PT RTRN 9FT ADLT (ELECTROSURGICAL) ×1 IMPLANT
GAUZE 4X4 16PLY RFD (DISPOSABLE) IMPLANT
GAUZE SPONGE 4X4 12PLY STRL (GAUZE/BANDAGES/DRESSINGS) ×3 IMPLANT
GLOVE BIO SURGEON STRL SZ8 (GLOVE) ×3 IMPLANT
GLOVE BIO SURGEON STRL SZ8.5 (GLOVE) ×3 IMPLANT
GLOVE EXAM NITRILE XL STR (GLOVE) IMPLANT
GOWN STRL REUS W/ TWL LRG LVL3 (GOWN DISPOSABLE) IMPLANT
GOWN STRL REUS W/ TWL XL LVL3 (GOWN DISPOSABLE) ×1 IMPLANT
GOWN STRL REUS W/TWL 2XL LVL3 (GOWN DISPOSABLE) IMPLANT
GOWN STRL REUS W/TWL LRG LVL3 (GOWN DISPOSABLE) ×6
GOWN STRL REUS W/TWL XL LVL3 (GOWN DISPOSABLE) ×6
KIT BASIN OR (CUSTOM PROCEDURE TRAY) ×3 IMPLANT
KIT TURNOVER KIT B (KITS) ×3 IMPLANT
NDL HYPO 21X1.5 SAFETY (NEEDLE) IMPLANT
NEEDLE HYPO 21X1.5 SAFETY (NEEDLE) ×6 IMPLANT
NEEDLE HYPO 22GX1.5 SAFETY (NEEDLE) ×3 IMPLANT
NS IRRIG 1000ML POUR BTL (IV SOLUTION) ×3 IMPLANT
OIL CARTRIDGE MAESTRO DRILL (MISCELLANEOUS) ×3
PACK LAMINECTOMY NEURO (CUSTOM PROCEDURE TRAY) ×3 IMPLANT
PAD ARMBOARD 7.5X6 YLW CONV (MISCELLANEOUS) ×3 IMPLANT
PATTIES SURGICAL .5 X1 (DISPOSABLE) IMPLANT
RUBBERBAND STERILE (MISCELLANEOUS) ×6 IMPLANT
SPONGE SURGIFOAM ABS GEL SZ50 (HEMOSTASIS) ×3 IMPLANT
STRIP CLOSURE SKIN 1/2X4 (GAUZE/BANDAGES/DRESSINGS) ×2 IMPLANT
SUT VIC AB 1 CT1 18XBRD ANBCTR (SUTURE) ×2 IMPLANT
SUT VIC AB 1 CT1 8-18 (SUTURE) ×6
SUT VIC AB 2-0 CP2 18 (SUTURE) ×6 IMPLANT
SYR 20ML LL LF (SYRINGE) ×4 IMPLANT
TOWEL GREEN STERILE (TOWEL DISPOSABLE) ×3 IMPLANT
TOWEL GREEN STERILE FF (TOWEL DISPOSABLE) ×3 IMPLANT
WATER STERILE IRR 1000ML POUR (IV SOLUTION) ×3 IMPLANT

## 2018-11-10 NOTE — H&P (Signed)
Subjective: The patient is a 78 year old white female who has complained of back and leg pain consistent with neurogenic claudication.  She has failed medical management and was worked up with lumbar x-rays and a lumbar MRI which demonstrated spinal stenosis.  I discussed the various treatment options.  She has decided to proceed with surgery.  Past Medical History:  Diagnosis Date  . Arthritis   . Cancer (HCC)    basal cell forehead  . Diabetes mellitus without complication ([place])    type II  . Hyperlipidemia   . Hypertension   . Lichen sclerosus   . Stroke [facility])     Past Surgical History:  Procedure Laterality Date  . BREAST EXCISIONAL BIOPSY Left 1991   NEG  . BREAST SURGERY Left 1991   lumpectomy  . CATARACT EXTRACTION, BILATERAL  11/2010  . COLONOSCOPY  2015  . DILATION AND CURETTAGE OF UTERUS  07/01/2009   Dr. Laurey Morale; hysteroscopy  . EYE SURGERY Left 10/22/2012   cataract extraction  . KYPHOPLASTY N/A 04/29/2018   Procedure: KYPHOPLASTY L2, DIABETIC;  Surgeon: Hessie Knows, MD;  Location: [facility] ORS;  Service: Orthopedics;  Laterality: N/A;  . ROTATOR CUFF REPAIR Right 2011  . TUBAL LIGATION      Allergies  Allergen Reactions  . Codeine Nausea Only  . Valsartan Other (See Comments)    Restless, tense and insomnic.  Marland Kitchen Nitrofurantoin Monohyd Macro Rash    Social History   Tobacco Use  . Smoking status: Former Smoker    Types: Cigarettes    Quit date: 02/24/1971    Years since quitting: 47.7  . Smokeless tobacco: Never Used  Substance Use Topics  . Alcohol use: Yes    Comment: Occasional beer    Family History  Problem Relation Age of Onset  . Diabetes Son        insulin dependent  . Arthritis Mother   . Breast cancer Neg Hx    Prior to Admission medications   Medication Sig Start Date End Date Taking? Authorizing Provider  acetaminophen (TYLENOL) 500 MG tablet Take 500 mg by mouth every 6 (six) hours as needed.   Yes [provider]  amLODipine  (NORVASC) 2.5 MG tablet Take 2.5 mg by mouth daily.   Yes [provider]  Calcium Carb-Cholecalciferol ([employer] CALCIUM 600 & VITAMIN D3 PO) Take 1 tablet by mouth daily.   Yes [provider]  gabapentin (NEURONTIN) 100 MG capsule Take 100 mg by mouth 3 (three) times daily.   Yes [provider]  insulin aspart (NOVOLOG) 100 UNIT/ML injection Inject 5-8 Units into the skin 3 (three) times daily with meals.    Yes [provider]  Insulin Glargine (TOUJEO SOLOSTAR) 300 UNIT/ML SOPN Inject 7 Units into the skin See admin instructions. 12:30p  Takes 7- 9   Yes [provider]  Lactobacillus (DIGESTIVE HEALTH PROBIOTIC) CAPS Take 1 capsule by mouth daily.    Yes [provider]  lisinopril (ZESTRIL) 40 MG tablet Take 40 mg by mouth daily.   Yes [provider]  magnesium gluconate (MAGONATE) 500 MG tablet Take 500 mg by mouth daily.   Yes [provider]  metoprolol tartrate (LOPRESSOR) 50 MG tablet Take 50 mg by mouth 2 (two) times daily.   Yes [provider]  Multiple Vitamin tablet Take 1 tablet by mouth daily.    Yes [provider]  traMADol (ULTRAM) 50 MG tablet Take by mouth every 6 (six) hours as needed.  Yes [provider]  Vitamin D, Ergocalciferol, (DRISDOL) 50000 units CAPS capsule Take 50,000 Units by mouth every 30 (thirty) days.   Yes [provider]  denosumab (PROLIA) 60 MG/ML SOSY injection Inject 60 mg into the skin every 6 (six) months.    [provider]  glucose blood test strip  02/20/14   [provider]     Review of Systems  Positive ROS: As above  All other systems have been reviewed and were otherwise negative with the exception of those mentioned in the HPI and as above.  Objective: Vital signs in last 24 hours: Temp:  [97.5 F (36.4 C)] 97.5 F (36.4 C) (09/16 0742) Pulse Rate:  [51] 51 (09/16 0742) Resp:  [18] 18 (09/16 0742) BP:  (175)/(59) 175/59 (09/16 0742) SpO2:  [100 %] 100 % (09/16 0742) Weight:  [52.6 kg] 52.6 kg (09/16 0742) Estimated body mass index is 18.16 kg/m as calculated from the following:   Height as of this encounter: 5\' 7"  (1.702 m).   Weight as of this encounter: 52.6 kg.   General Appearance: Alert Head: Normocephalic, without obvious abnormality, atraumatic Eyes: PERRL, conjunctiva/corneas clear, EOM's intact,    Ears: Normal  Throat: Normal  Neck: Supple, Back: unremarkable Lungs: Clear to auscultation bilaterally, respirations unlabored Heart: Regular rate and rhythm, no murmur, rub or gallop Abdomen: Soft, non-tender Extremities: Extremities normal, atraumatic, no cyanosis or edema Skin: unremarkable  NEUROLOGIC:   Mental status: alert and oriented,Motor Exam - grossly normal Sensory Exam - grossly normal Reflexes:  Coordination - grossly normal Gait - grossly normal Balance - grossly normal Cranial Nerves: I: smell Not tested  II: visual acuity  OS: Normal  OD: Normal   II: visual fields Full to confrontation  II: pupils Equal, round, reactive to light  III,VII: ptosis None  III,IV,VI: extraocular muscles  Full ROM  V: mastication Normal  V: facial light touch sensation  Normal  V,VII: corneal reflex  Present  VII: facial muscle function - upper  Normal  VII: facial muscle function - lower Normal  VIII: hearing Not tested  IX: soft palate elevation  Normal  IX,X: gag reflex Present  XI: trapezius strength  5/5  XI: sternocleidomastoid strength 5/5  XI: neck flexion strength  5/5  XII: tongue strength  Normal    Data Review Lab Results  Component Value Date   WBC 6.3 11/08/2018   HGB 15.4 (H) 11/08/2018   HCT 47.4 (H) 11/08/2018   MCV 95.0 11/08/2018   PLT 269 11/08/2018   Lab Results  Component Value Date   NA 136 11/08/2018   K 4.3 11/08/2018   CL 97 (L) 11/08/2018   CO2 29 11/08/2018   BUN 16 11/08/2018   CREATININE 0.62 11/08/2018   GLUCOSE 100  (H) 11/08/2018   No results found for: INR, PROTIME  Assessment/Plan: L2-3, L3-4 and L4-5 spinal stenosis, lumbago, lumbar radiculopathy, neurogenic claudication: I have discussed the situation with the patient.  I have reviewed her imaging studies with her and pointed out the abnormalities.  We have discussed the various treatment options including surgery.  I have described the surgical treatment option of an L2-3, L3-4 and L4-5 laminectomy/laminotomy/foraminotomies.  I have shown her surgical models.  I have given her a surgical pamphlet.  We have discussed the risks, benefits, alternatives, expected postoperative course, and likelihood of achieving our goals with surgery.  I have answered all her questions.  She has decided to proceed with surgery.   Dellis Filbert  D Dechelle Attaway 11/10/2018 9:20 AM

## 2018-11-10 NOTE — Evaluation (Signed)
Physical Therapy Evaluation Patient Details Name: Denise Sherman MRN: [id number] DOB: [birth year] Today's Date: 11/10/2018   History of Present Illness  Pt is a 78 y/o female s/p L2-5 laminectomy. PMH includes DM, HTN, CVA.   Clinical Impression  Patient is s/p above surgery resulting in the deficits listed below (see PT Problem List). Pt unsteady with gait and with LOB X 3-4. Required min to mod A, however, feel this was likely due to medications. Educated about back precautions and generalized walking program. Will follow up next session to determine need for AD and to practice stairs. Patient will benefit from skilled PT to increase their independence and safety with mobility (while adhering to their precautions) to allow discharge to the venue listed below.     Follow Up Recommendations No PT follow up    Equipment Recommendations  None recommended by PT    Recommendations for Other Services       Precautions / Restrictions Precautions Precautions: Back Precaution Booklet Issued: Yes (comment) Precaution Comments: Reviewed back precautions with pt.  Restrictions Weight Bearing Restrictions: No      Mobility  Bed Mobility Overal bed mobility: Needs Assistance Bed Mobility: Rolling;Sidelying to Sit;Sit to Sidelying Rolling: Supervision Sidelying to sit: Supervision     Sit to sidelying: Supervision General bed mobility comments: Pt requiring supervision to ensure log roll technique. Cues for sequencing.   Transfers Overall transfer level: Needs assistance Equipment used: 1 person hand held assist Transfers: Sit to/from Stand Sit to Stand: Min assist         General transfer comment: Min A for steadying assist. Pt with increased sway upon standing and required assist for stability.   Ambulation/Gait Ambulation/Gait assistance: Min assist;Mod assist Gait Distance (Feet): 150 Feet Assistive device: 1 person hand held assist Gait Pattern/deviations: Step-through  pattern;Decreased stride length;Staggering right;Staggering left Gait velocity: Decreased   General Gait Details: Slow, unsteady gait. Pt reporting some wooziness which likely contributed to unsteadiness. Pt requiring min up to mod A for steadying secondary to LOB X3-4 throughout gait. Educated about generalized walking program to perform at home. Will attempt with AD next session, if pt still unsteady.   Stairs            Wheelchair Mobility    Modified Rankin (Stroke Patients Only)       Balance Overall balance assessment: Needs assistance Sitting-balance support: No upper extremity supported;Feet supported Sitting balance-Leahy Scale: Good     Standing balance support: Single extremity supported;During functional activity Standing balance-Leahy Scale: Poor Standing balance comment: Reliant on 1 UE and external support                              Pertinent Vitals/Pain Pain Assessment: Faces Faces Pain Scale: Hurts a little bit Pain Location: back and L thigh Pain Descriptors / Indicators: Aching Pain Intervention(s): Limited activity within patient's tolerance;Monitored during session;Repositioned    Home Living Family/patient expects to be discharged to:: Private residence Living Arrangements: Spouse/significant other Available Help at Discharge: Family;Available 24 hours/day Type of Home: House Home Access: Stairs to enter Entrance Stairs-Rails: None Entrance Stairs-Number of Steps: 2 Home Layout: One level Home Equipment: Walker - 2 wheels      Prior Function Level of Independence: Independent               Hand Dominance        Extremity/Trunk Assessment   Upper Extremity Assessment Upper Extremity Assessment: Defer  to OT evaluation    Lower Extremity Assessment Lower Extremity Assessment: Generalized weakness;LLE deficits/detail LLE Deficits / Details: Reports dull ache in L hip    Cervical / Trunk Assessment Cervical /  Trunk Assessment: Other exceptions Cervical / Trunk Exceptions: s/p lumbar surgery   Communication   Communication: No difficulties  Cognition Arousal/Alertness: Awake/alert Behavior During Therapy: WFL for tasks assessed/performed Overall Cognitive Status: Within Functional Limits for tasks assessed                                        General Comments      Exercises     Assessment/Plan    PT Assessment Patient needs continued PT services  PT Problem List Decreased strength;Decreased balance;Decreased mobility;Decreased knowledge of use of DME;Decreased knowledge of precautions;Pain       PT Treatment Interventions DME instruction;Gait training;Stair training;Functional mobility training;Therapeutic activities;Therapeutic exercise;Balance training;Patient/family education    PT Goals (Current goals can be found in the Care Plan section)  Acute Rehab PT Goals Patient Stated Goal: to be able to be more active PT Goal Formulation: With patient Time For Goal Achievement: 11/24/18 Potential to Achieve Goals: Good    Frequency Min 5X/week   Barriers to discharge        Co-evaluation               AM-PAC PT "6 Clicks" Mobility  Outcome Measure Help needed turning from your back to your side while in a flat bed without using bedrails?: None Help needed moving from lying on your back to sitting on the side of a flat bed without using bedrails?: A Little Help needed moving to and from a bed to a chair (including a wheelchair)?: A Little Help needed standing up from a chair using your arms (e.g., wheelchair or bedside chair)?: A Little Help needed to walk in hospital room?: A Lot Help needed climbing 3-5 steps with a railing? : A Lot 6 Click Score: 17    End of Session Equipment Utilized During Treatment: Gait belt Activity Tolerance: Patient tolerated treatment well Patient left: in bed;with call bell/phone within reach Nurse Communication:  Mobility status PT Visit Diagnosis: Unsteadiness on feet (R26.81);Muscle weakness (generalized) (M62.81);Pain Pain - part of body: (back)    Time: 1532-1550 PT Time Calculation (min) (ACUTE ONLY): 18 min   Charges:   PT Evaluation $PT Eval Low Complexity: [place], PT, DPT  Acute Rehabilitation Services  Pager: [phone] Office: [phone]   Rudean Hitt 11/10/2018, 5:58 PM

## 2018-11-10 NOTE — Transfer of Care (Signed)
Immediate Anesthesia Transfer of Care Note  Patient: [name]  Procedure(s) Performed: LAMINECTOMY AND FORAMINOTOMY LUMBAR TWO- LUMBAR THREE LUMBAR THREE- LUMBAR FOUR, LUMBAR FOUR- LUMBAR FIVE (N/A )  Patient Location: PACU  Anesthesia Type:General  Level of Consciousness: drowsy  Airway & Oxygen Therapy: Patient Spontanous Breathing and Patient connected to nasal cannula oxygen  Post-op Assessment: Report given to RN, Post -op Vital signs reviewed and stable and Patient moving all extremities  Post vital signs: Reviewed and stable  Last Vitals:  Vitals Value Taken Time  BP 180/86 11/10/18 1148  Temp 36.4 C 11/10/18 1148  Pulse 70 11/10/18 1152  Resp 13 11/10/18 1152  SpO2 97 % 11/10/18 1152  Vitals shown include unvalidated device data.  Last Pain:  Vitals:   11/10/18 0742  TempSrc: Oral         Complications: No apparent anesthesia complications

## 2018-11-10 NOTE — Anesthesia Preprocedure Evaluation (Signed)
Anesthesia Evaluation  Patient identified by MRN, date of birth, ID band Patient awake    Reviewed: Allergy & Precautions, H&P , NPO status , Patient's Chart, lab work & pertinent test results  Airway Mallampati: II   Neck ROM: full    Dental   Pulmonary former smoker,    breath sounds clear to auscultation       Cardiovascular hypertension,  Rhythm:regular Rate:Normal     Neuro/Psych  Neuromuscular disease CVA    GI/Hepatic   Endo/Other  diabetes, Type 2  Renal/GU      Musculoskeletal  (+) Arthritis ,   Abdominal   Peds  Hematology   Anesthesia Other Findings   Reproductive/Obstetrics                             Anesthesia Physical Anesthesia Plan  ASA: II  Anesthesia Plan: General   Post-op Pain Management:    Induction: Intravenous  PONV Risk Score and Plan: 3 and Ondansetron, Dexamethasone and Treatment may vary due to age or medical condition  Airway Management Planned: Oral ETT  Additional Equipment:   Intra-op Plan:   Post-operative Plan: Extubation in OR  Informed Consent: I have reviewed the patients History and Physical, chart, labs and discussed the procedure including the risks, benefits and alternatives for the proposed anesthesia with the patient or authorized representative who has indicated his/her understanding and acceptance.       Plan Discussed with: CRNA, Anesthesiologist and Surgeon  Anesthesia Plan Comments:         Anesthesia Quick Evaluation

## 2018-11-10 NOTE — Anesthesia Procedure Notes (Signed)
Procedure Name: Intubation Date/Time: 11/10/2018 9:36 AM Performed by: Kamilo Och T, CRNA Pre-anesthesia Checklist: Patient identified, Emergency Drugs available, Suction available and Patient being monitored Patient Re-evaluated:Patient Re-evaluated prior to induction Oxygen Delivery Method: Circle system utilized Preoxygenation: Pre-oxygenation with 100% oxygen Induction Type: IV induction and Rapid sequence Laryngoscope Size: Miller and 2 Grade View: Grade I Tube type: Oral Tube size: 7.5 mm Number of attempts: 1 Airway Equipment and Method: Patient positioned with wedge pillow and Stylet Placement Confirmation: ETT inserted through vocal cords under direct vision,  positive ETCO2 and breath sounds checked- equal and bilateral Secured at: 21 cm Tube secured with: Tape Dental Injury: Teeth and Oropharynx as per pre-operative assessment

## 2018-11-10 NOTE — Op Note (Signed)
Brief history: The patient is a 78 year old white female who is had chronic back pain.  She has had previous lumbar compression fractures which were treated with vertebroplasty.  More recently she developed persistent back and leg pain worsened with standing and walking consistent with neurogenic claudication.  She has failed medical management and was worked up with a lumbar MRI which demonstrated spinal stenosis.  I discussed the various treatment options with her.  She has weighed the risks, benefits and alternatives of surgery and decided proceed with a lumbar laminectomy.  Preoperative diagnosis: L2-3, L3-4 and L4-5 spinal stenosis, lumbago, lumbar radiculopathy, neurogenic claudication  Postoperative diagnosis: The same  Procedure: L3-4 and L4-5 laminectomy/foraminotomy; bilateral L2-3 laminotomy/foraminotomies using microdissection  Surgeon: Dr. Earle Gell  Asst.: Dr. Jovita Gamma and Arnetha Massy nurse practitioner  Anesthesia: Gen. endotracheal  Estimated blood loss: 125 cc  Drains: None  Complications: None  Description of procedure: The patient was brought to the operating room by the anesthesia team. General endotracheal anesthesia was induced. The patient was turned to the prone position on the Wilson frame. The patient's lumbosacral region was then prepared with Betadine scrub and Betadine solution. Sterile drapes were applied.  I then injected the area to be incised with Marcaine with epinephrine solution. I then used a scalpel to make a linear midline incision over the L2-3, L3-4 and L4-5 intervertebral disc space. I then used electrocautery to perform a bilateral  subperiosteal dissection exposing the spinous process and lamina of L2, L3, L4 and L5. We obtained intraoperative radiograph to confirm our location. I then inserted the [facility] retractor for exposure.  We began the decompression by incising the interspinous ligament at L2-3, L3-4 and L4-5 with  electrocautery.  We used the [employer] to remove the spinous process and part of the lamina at L3 and L4.  We then brought the operative microscope into the field. Under its magnification and illumination we completed the microdissection. I used a high-speed drill to perform a laminotomy at bilaterally at L2-3, L3-4 and L4-5. I then used a Kerrison punches to complete the laminectomy at L3-4 and L4-5 and to widen the laminotomies at L2-3 and removed the ligamentum flavum at L2-3, L3-4 and L4-5. We then used microdissection to free up the thecal sac and the L3, L4 and L5 nerve root from the epidural tissue. I then used a Kerrison punch to perform a foraminotomy at about the L3, L4 and L5 nerve root.  We inspected the intervertebral disc at L2-3, L3-4 and L4-5.  There were no herniations.  I then palpated along the ventral surface of the thecal sac and along exit route of the bilateral L3, L4 and L5 nerve root and noted that the neural structures were well decompressed. This completed the decompression.  We then obtained hemostasis using bipolar electrocautery. We irrigated the wound out with bacitracin solution. We then removed the retractor. We then reapproximated the patient's thoracolumbar fascia with interrupted #1 Vicryl suture. We then reapproximated the patient's subcutaneous tissue with interrupted 2-0 Vicryl suture. We then reapproximated patient's skin with Steri-Strips and benzoin. The was then coated with bacitracin ointment. The drapes were removed. The patient was subsequently returned to the supine position where they were extubated by the anesthesia team. The patient was then transported to the postanesthesia care unit in stable condition. All sponge instrument and needle counts were reportedly correct at the end of this case.

## 2018-11-11 ENCOUNTER — Emergency Department: Payer: PPO

## 2018-11-11 ENCOUNTER — Encounter (HOSPITAL_COMMUNITY): Payer: Self-pay | Admitting: Neurosurgery

## 2018-11-11 ENCOUNTER — Emergency Department
Admission: EM | Admit: 2018-11-11 | Discharge: 2018-11-11 | Disposition: A | Discharge status: Home / Self Care | Payer: PPO | Attending: Emergency Medicine | Admitting: Emergency Medicine

## 2018-11-11 ENCOUNTER — Other Ambulatory Visit: Payer: Self-pay

## 2018-11-11 DIAGNOSIS — Z794 Long term (current) use of insulin: Secondary | ICD-10-CM | POA: Insufficient documentation

## 2018-11-11 DIAGNOSIS — Z79899 Other long term (current) drug therapy: Secondary | ICD-10-CM | POA: Insufficient documentation

## 2018-11-11 DIAGNOSIS — R0902 Hypoxemia: Secondary | ICD-10-CM | POA: Diagnosis not present

## 2018-11-11 DIAGNOSIS — R55 Syncope and collapse: Secondary | ICD-10-CM

## 2018-11-11 DIAGNOSIS — Z87891 Personal history of nicotine dependence: Secondary | ICD-10-CM | POA: Insufficient documentation

## 2018-11-11 DIAGNOSIS — E119 Type 2 diabetes mellitus without complications: Secondary | ICD-10-CM | POA: Diagnosis not present

## 2018-11-11 DIAGNOSIS — R001 Bradycardia, unspecified: Secondary | ICD-10-CM

## 2018-11-11 DIAGNOSIS — M48062 Spinal stenosis, lumbar region with neurogenic claudication: Secondary | ICD-10-CM | POA: Diagnosis not present

## 2018-11-11 DIAGNOSIS — I10 Essential (primary) hypertension: Secondary | ICD-10-CM | POA: Insufficient documentation

## 2018-11-11 DIAGNOSIS — R110 Nausea: Secondary | ICD-10-CM | POA: Diagnosis not present

## 2018-11-11 DIAGNOSIS — R1111 Vomiting without nausea: Secondary | ICD-10-CM | POA: Diagnosis not present

## 2018-11-11 LAB — CBC
HCT: 39.3 % (ref 36.0–46.0)
Hemoglobin: 13.3 g/dL (ref 12.0–15.0)
MCH: 30.9 pg (ref 26.0–34.0)
MCHC: 33.8 g/dL (ref 30.0–36.0)
MCV: 91.2 fL (ref 80.0–100.0)
Platelets: 245 10*3/uL (ref 150–400)
RBC: 4.31 MIL/uL (ref 3.87–5.11)
RDW: 13.3 % (ref 11.5–15.5)
WBC: 9 10*3/uL (ref 4.0–10.5)
nRBC: 0 % (ref 0.0–0.2)

## 2018-11-11 LAB — BASIC METABOLIC PANEL
Anion gap: 7 (ref 5–15)
BUN: 18 mg/dL (ref 8–23)
CO2: 30 mmol/L (ref 22–32)
Calcium: 8.8 mg/dL — ABNORMAL LOW (ref 8.9–10.3)
Chloride: 92 mmol/L — ABNORMAL LOW (ref 98–111)
Creatinine, Ser: 0.73 mg/dL (ref 0.44–1.00)
GFR calc Af Amer: >60 mL/min (ref >60)
GFR calc non Af Amer: >60 mL/min (ref >60)
Glucose, Bld: 85 mg/dL (ref 70–99)
Potassium: 4.1 mmol/L (ref 3.5–5.1)
Sodium: 129 mmol/L — ABNORMAL LOW (ref 135–145)

## 2018-11-11 LAB — URINALYSIS, COMPLETE (UACMP) WITH MICROSCOPIC
Bacteria, UA: NONE SEEN
Bilirubin Urine: NEGATIVE
Glucose, UA: NEGATIVE mg/dL
Hgb urine dipstick: NEGATIVE
Ketones, ur: NEGATIVE mg/dL
Leukocytes,Ua: NEGATIVE
Nitrite: NEGATIVE
Protein, ur: NEGATIVE mg/dL
Specific Gravity, Urine: 1.003 — ABNORMAL LOW (ref 1.005–1.030)
Squamous Epithelial / HPF: NONE SEEN (ref 0–5)
pH: 8 (ref 5.0–8.0)

## 2018-11-11 LAB — GLUCOSE, CAPILLARY
Glucose-Capillary: 264 mg/dL — ABNORMAL HIGH (ref 70–99)
Glucose-Capillary: 48 mg/dL — ABNORMAL LOW (ref 70–99)
Glucose-Capillary: 123 mg/dL — ABNORMAL HIGH (ref 70–99)

## 2018-11-11 LAB — TROPONIN I (HIGH SENSITIVITY): Troponin I (High Sensitivity): 5 ng/L (ref <18)

## 2018-11-11 MED ORDER — FENTANYL CITRATE (PF) 100 MCG/2ML IJ SOLN
25.0000 ug | Freq: Once | INTRAMUSCULAR | Status: AC
  Administered 2018-11-11: 22:00:00 25 ug via INTRAVENOUS
  Filled 2018-11-11: qty 2

## 2018-11-11 MED ORDER — SODIUM CHLORIDE 0.9 % IV BOLUS
1000.0000 mL | Freq: Once | INTRAVENOUS | Status: AC
  Administered 2018-11-11: 20:00:00 1000 mL via INTRAVENOUS

## 2018-11-11 MED ORDER — OXYCODONE HCL 5 MG PO TABS: 5.0000 mg | ORAL_TABLET | ORAL | 0 refills | Status: DC | PRN

## 2018-11-11 MED ORDER — ACETAMINOPHEN 325 MG PO TABS
650.0000 mg | ORAL_TABLET | Freq: Once | ORAL | Status: AC
  Administered 2018-11-11: 20:00:00 650 mg via ORAL
  Filled 2018-11-11: qty 2

## 2018-11-11 MED FILL — Thrombin For Soln 5000 Unit: CUTANEOUS | Qty: 2 | Status: AC

## 2018-11-11 NOTE — ED Notes (Signed)
Applied external catheter to patient.AS 

## 2018-11-11 NOTE — ED Notes (Signed)
Pt given apple juice, graham crackers, and peanut butter at this time d/t hypoglycemic reading from Glucometer (48 mg/dL); Dr Ellender Hose made aware.

## 2018-11-11 NOTE — Progress Notes (Signed)
Physical Therapy Treatment Patient Details Name: Denise Sherman MRN: [id number] DOB: 02-May-1940 Today's Date: 11/11/2018    History of Present Illness Pt is a 78 y/o female s/p L2-5 laminectomy. PMH includes DM, HTN, CVA.     PT Comments    Pt progressing towards physical therapy goals. Was able to perform transfers and ambulation with gross supervision for safety, to modified independent with SPC. Pt unsteady at times without UE support however was able to recover without assistance. Reinforced education regarding precautions, activity progression, car transfer, and positioning recommendations. Will continue to follow and progress as able per POC.     Follow Up Recommendations  No PT follow up     Equipment Recommendations  None recommended by PT    Recommendations for Other Services       Precautions / Restrictions Precautions Precautions: Back Precaution Booklet Issued: Yes (comment) Precaution Comments: Reviewed back precautions with pt.  Restrictions Weight Bearing Restrictions: No    Mobility  Bed Mobility               General bed mobility comments: Pt received sitting up EOB.   Transfers Overall transfer level: Needs assistance Equipment used: None Transfers: Sit to/from Stand Sit to Stand: Supervision         General transfer comment: Light supervision for safety. Pt appears mildly unsteady initially however recovered without assistance.   Ambulation/Gait Ambulation/Gait assistance: Supervision;Modified independent (Device/Increase time) Gait Distance (Feet): 300 Feet Assistive device: None;Straight cane Gait Pattern/deviations: Step-through pattern;Decreased stride length;Trunk flexed Gait velocity: Decreased Gait velocity interpretation: 1.31 - 2.62 ft/sec, indicative of limited community ambulator General Gait Details: VC's for improved posture. Pt was able to ambulate fairly well without AD however was unsteady and taking occasional side steps to  recover. With SPC, pt was able to progress to a mod I level by end of session.    Stairs             Wheelchair Mobility    Modified Rankin (Stroke Patients Only)       Balance Overall balance assessment: Needs assistance Sitting-balance support: No upper extremity supported;Feet supported Sitting balance-Leahy Scale: Good     Standing balance support: Single extremity supported;During functional activity Standing balance-Leahy Scale: Fair                              Cognition Arousal/Alertness: Awake/alert Behavior During Therapy: WFL for tasks assessed/performed Overall Cognitive Status: Within Functional Limits for tasks assessed                                        Exercises      General Comments        Pertinent Vitals/Pain Pain Assessment: Faces Faces Pain Scale: Hurts a little bit Pain Location: back and L thigh Pain Descriptors / Indicators: Operative site guarding Pain Intervention(s): Monitored during session    Home Living Family/patient expects to be discharged to:: Private residence Living Arrangements: Spouse/significant other                  Prior Function            PT Goals (current goals can now be found in the care plan section) Acute Rehab PT Goals Patient Stated Goal: to be able to be more active PT Goal Formulation: With patient Time For Goal Achievement:  11/24/18 Potential to Achieve Goals: Good Progress towards PT goals: Progressing toward goals    Frequency    Min 5X/week      PT Plan Current plan remains appropriate    Co-evaluation              AM-PAC PT "6 Clicks" Mobility   Outcome Measure  Help needed turning from your back to your side while in a flat bed without using bedrails?: None Help needed moving from lying on your back to sitting on the side of a flat bed without using bedrails?: None Help needed moving to and from a bed to a chair (including a  wheelchair)?: A Little Help needed standing up from a chair using your arms (e.g., wheelchair or bedside chair)?: A Little Help needed to walk in hospital room?: A Little Help needed climbing 3-5 steps with a railing? : A Little 6 Click Score: 20    End of Session Equipment Utilized During Treatment: Gait belt Activity Tolerance: Patient tolerated treatment well Patient left: in bed;with call bell/phone within reach Nurse Communication: Mobility status PT Visit Diagnosis: Unsteadiness on feet (R26.81);Muscle weakness (generalized) (M62.81);Pain Pain - part of body: (back)     Time: [id number] PT Time Calculation (min) (ACUTE ONLY): 18 min  Charges:  $Gait Training: 8-22 mins                     Denise Sherman, PT, DPT Acute Rehabilitation Services Pager: [phone] Office: [phone]    Thelma Comp 11/11/2018, 11:37 AM

## 2018-11-11 NOTE — Discharge Summary (Signed)
Physician Discharge Summary  Patient ID: Denise Sherman MRN: [id number] DOB/AGE: Nov 04, 1940 78 y.o.  Admit date: 11/10/2018 Discharge date: 11/11/2018  Admission Diagnoses: Lumbar spinal stenosis, neurogenic location, lumbago, lumbar radiculopathy  Discharge Diagnoses: The same Active Problems:   Spinal stenosis of lumbar region with neurogenic claudication   Discharged Condition: good  Hospital Course: I performed an L2-3, L3-4 and L4-5 laminectomy/laminotomy/foraminotomies on the patient on 11/10/2018.  The surgery went well.  The patient's postoperative course was unremarkable.  On postoperative day #1 she requested discharge to home.  She was given written and oral discharge instructions.  All her questions were answered.  Consults: Physical therapy, Occupational Therapy Significant Diagnostic Studies: None Treatments: L2-3, L3-4 and L4-5 laminectomy/laminotomy/foraminotomies using microdissection Discharge Exam: Blood pressure 119/61, pulse (!) 55, temperature 98.4 F (36.9 C), temperature source Oral, resp. rate 18, height 5\' 7"  (1.702 m), weight 52.6 kg, SpO2 100 %. The patient is alert and pleasant.  She looks well.  Her strength is normal.  Disposition: Home  Discharge Instructions    Call MD for:  difficulty breathing, headache or visual disturbances   Complete by: As directed    Call MD for:  extreme fatigue   Complete by: As directed    Call MD for:  hives   Complete by: As directed    Call MD for:  persistant dizziness or light-headedness   Complete by: As directed    Call MD for:  persistant nausea and vomiting   Complete by: As directed    Call MD for:  redness, tenderness, or signs of infection (pain, swelling, redness, odor or green/yellow discharge around incision site)   Complete by: As directed    Call MD for:  severe uncontrolled pain   Complete by: As directed    Call MD for:  temperature >100.4   Complete by: As directed    Diet - low sodium heart  healthy   Complete by: As directed    Discharge instructions   Complete by: As directed    Call [phone] for a followup appointment. Take a stool softener while you are using pain medications.   Driving Restrictions   Complete by: As directed    Do not drive for 2 weeks.   Increase activity slowly   Complete by: As directed    Lifting restrictions   Complete by: As directed    Do not lift more than 5 pounds. No excessive bending or twisting.   May shower / Bathe   Complete by: As directed    Remove the dressing for 3 days after surgery.  You may shower, but leave the incision alone.   Remove dressing in 48 hours   Complete by: As directed    Your stitches are under the scan and will dissolve by themselves. The Steri-Strips will fall off after you take a few showers. Do not rub back or pick at the wound, Leave the wound alone.     Allergies as of 11/11/2018      Reactions   Codeine Nausea Only   Valsartan Other (See Comments)   Restless, tense and insomnic.   Nitrofurantoin Monohyd Macro Rash      Medication List    TAKE these medications   acetaminophen 500 MG tablet Commonly known as: TYLENOL Take 500 mg by mouth every 6 (six) hours as needed.   amLODipine 2.5 MG tablet Commonly known as: NORVASC Take 2.5 mg by mouth daily.   [employer] CALCIUM 600 & VITAMIN D3 PO  Take 1 tablet by mouth daily.   denosumab 60 MG/ML Sosy injection Commonly known as: PROLIA Inject 60 mg into the skin every 6 (six) months.   Digestive Health Probiotic Caps Take 1 capsule by mouth daily.   gabapentin 100 MG capsule Commonly known as: NEURONTIN Take 100 mg by mouth 3 (three) times daily.   glucose blood test strip   insulin aspart 100 UNIT/ML injection Commonly known as: novoLOG Inject 5-8 Units into the skin 3 (three) times daily with meals.   lisinopril 40 MG tablet Commonly known as: ZESTRIL Take 40 mg by mouth daily.   magnesium gluconate 500 MG tablet Commonly known as:  MAGONATE Take 500 mg by mouth daily.   metoprolol tartrate 50 MG tablet Commonly known as: LOPRESSOR Take 50 mg by mouth 2 (two) times daily.   Multiple Vitamin tablet Take 1 tablet by mouth daily.   oxyCODONE 5 MG immediate release tablet Commonly known as: Oxy IR/ROXICODONE Take 1 tablet (5 mg total) by mouth every 4 (four) hours as needed for moderate pain ((score 4 to 6)).   Toujeo SoloStar 300 UNIT/ML Sopn Generic drug: Insulin Glargine Inject 7 Units into the skin See admin instructions. 12:30p  Takes 7- 9   traMADol 50 MG tablet Commonly known as: ULTRAM Take by mouth every 6 (six) hours as needed.   Vitamin D (Ergocalciferol) 1.25 MG (50000 UT) Caps capsule Commonly known as: DRISDOL Take 50,000 Units by mouth every 30 (thirty) days.        Signed: Ophelia Charter 11/11/2018, 10:24 AM

## 2018-11-11 NOTE — Discharge Instructions (Addendum)
For your pain: - AT THE LEAST, take tylenol 650 mg every 6 hours around-the-clock for 2-3 days then as needed - Take the pain meds as prescribed - Take deep breaths when feeling pain  For your passing out: - I'd recommend HOLDING/NOT TAKING your metoprolol in the morning. Keep an eye on your heart rate as well. If you feel fine and your heart rate is okay, you can take it at night but sometimes having a LOW heart rate could contribute to passing out.  - Call your doctor in the AM

## 2018-11-11 NOTE — Anesthesia Postprocedure Evaluation (Signed)
Anesthesia Post Note  Patient: Denise Sherman  Procedure(s) Performed: LAMINECTOMY AND FORAMINOTOMY LUMBAR TWO- LUMBAR THREE LUMBAR THREE- LUMBAR FOUR, LUMBAR FOUR- LUMBAR FIVE (N/A )     Patient location during evaluation: PACU Anesthesia Type: General Level of consciousness: awake and alert Pain management: pain level controlled Vital Signs Assessment: post-procedure vital signs reviewed and stable Respiratory status: spontaneous breathing, nonlabored ventilation, respiratory function stable and patient connected to nasal cannula oxygen Cardiovascular status: blood pressure returned to baseline and stable Postop Assessment: no apparent nausea or vomiting Anesthetic complications: no    Last Vitals:  Vitals:   11/11/18 0411 11/11/18 0708  BP: (!) 127/55 119/61  Pulse: (!) 52 (!) 55  Resp: 18 18  Temp: 36.6 C 36.9 C  SpO2: 100% 100%    Last Pain:  Vitals:   11/11/18 1030  TempSrc:   PainSc: [place]

## 2018-11-11 NOTE — Progress Notes (Signed)
Patient is discharged from room 3C05 at this time. Alert and in stable condition. IV site d/c'd and instructions read to patient with understanding verbalized. Left unit via wheelchair with all belongings at side. 

## 2018-11-11 NOTE — ED Triage Notes (Signed)
Pt arrives to ED via [facility] from home with c/o a syncopal episode. EMS states pt was eating dinner when she experienced a sudden LOC for a "very short time". Pt is diabetic; EMS reports CBG 158. Pt states she also had 3 episodes of emesis upon regaining consciousness. Pt denies any head injury; states she was sitting in a chair when the LOC occurred. Pt is A&O, in NAD; RR even, regular, and unlabored. Of note, pt was just d/c'd from [facility] earlier today following back surgery for pinched nerves in her back.

## 2018-11-11 NOTE — ED Provider Notes (Addendum)
[facility] Emergency Department Provider Note  ____________________________________________   First MD Initiated Contact with Patient 11/11/18 1924     (approximate)  I have reviewed the triage vital signs and the nursing notes.   HISTORY  Chief Complaint Loss of Consciousness and Emesis    HPI Denise Sherman is a 78 y.o. female  With PMhx HTN, HLD, DM, here with syncopal episode.   The patient states that she recently underwent surgery for spinal stenosis yesterday.  She has been recovering fairly well and actually went home this morning.  She did not eat or drink very much.  She states her pain had been largely controlled.  She is going to sit down to have dinner tonight, and she felt a twinge of pain.  She sat down and was initially fine, but then began to feel dizzy, diaphoretic, lightheaded, and shaky.  Per family, the patient then rolled back, had some generalized but right greater than left shaking, and was unresponsive.  Her eyes rolled back.  They laid her back, and she regained consciousness, reportedly was confused, then vomited 3 or 4 times.  She states that she now feels completely back to her baseline.  She has a history of vasovagal syncope in the past.  She is adamant she had no chest pain or shortness of breath.  Her back pain is now resolved, but she only feels twinges when she was moving throughout the day.  She has not taken any pain meds today.  No other medical complaints.       Past Medical History:  Diagnosis Date   Arthritis    Cancer ([place])    basal cell forehead   Diabetes mellitus without complication ([place])    type II   Hyperlipidemia    Hypertension    Lichen sclerosus    Stroke [facility])     Patient Active Problem List   Diagnosis Date Noted   Spinal stenosis of lumbar region with neurogenic claudication 11/10/2018   Degeneration of intervertebral disc of lumbar region 05/14/2015   Neuritis or radiculitis due to  rupture of lumbar intervertebral disc 05/14/2015   Hip pain 10/23/2014   Compression fracture of lumbar vertebra ([place]) 07/25/2014   CN (constipation) 07/25/2014   Narrowing of intervertebral disc space 07/25/2014   Hypercholesteremia [id number]   Lichen sclerosus [id number]   Cervical pain 07/25/2014   OP (osteoporosis) 07/25/2014   Nephropyelitis 07/25/2014   Temporary cerebral vascular dysfunction 07/25/2014   Tubular adenoma 07/25/2014   Abnormal loss of weight 07/25/2014   Cervical radiculitis 06/22/2014   Benign essential HTN 08/02/2013   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 Laurey Morale; hysteroscopy   EYE SURGERY Left 10/22/2012   cataract extraction   KYPHOPLASTY N/A 04/29/2018   Procedure: KYPHOPLASTY L2, DIABETIC;  Surgeon: Hessie Knows, MD;  Location: [facility] ORS;  Service: Orthopedics;  Laterality: N/A;   LUMBAR LAMINECTOMY/DECOMPRESSION MICRODISCECTOMY N/A 11/10/2018   Procedure: LAMINECTOMY AND FORAMINOTOMY LUMBAR TWO- LUMBAR THREE LUMBAR THREE- LUMBAR FOUR, LUMBAR FOUR- LUMBAR FIVE;  Surgeon: Newman Pies, MD;  Location: [place] OR;  Service: Neurosurgery;  Laterality: N/A;   ROTATOR CUFF REPAIR Right 2011   TUBAL LIGATION      Prior to Admission medications   Medication Sig Start Date End Date Taking? Authorizing Provider  amLODipine (NORVASC) 2.5 MG tablet Take 2.5 mg by mouth daily.   Yes [provider]  Calcium Carb-Cholecalciferol ([employer] CALCIUM 600 & VITAMIN D3 PO) Take 1 tablet by mouth daily.   Yes [provider]    gabapentin (NEURONTIN) 100 MG capsule Take 100 mg by mouth 3 (three) times daily.   Yes [provider]  insulin aspart (NOVOLOG) 100 UNIT/ML injection Inject 5-8 Units into the skin 3 (three) times daily with meals.    Yes [provider]  Insulin Glargine (TOUJEO SOLOSTAR) 300 UNIT/ML SOPN Inject 7 Units into the skin See admin instructions. 12:30p  Takes 7- 9   Yes [provider]  lisinopril (ZESTRIL) 40 MG tablet Take 40 mg by mouth daily.   Yes [provider]  magnesium gluconate (MAGONATE) 500 MG tablet Take 500 mg by mouth daily.   Yes [provider]  metoprolol tartrate (LOPRESSOR) 50 MG tablet Take 50 mg by mouth 2 (two) times daily.   Yes [provider]  Multiple Vitamin tablet Take 1 tablet by mouth daily.    Yes [provider]  oxyCODONE (OXY IR/ROXICODONE) 5 MG immediate release tablet Take 1 tablet (5 mg total) by mouth every 4 (four) hours as needed for moderate pain ((score 4 to 6)). 11/11/18  Yes Newman Pies, MD  Vitamin D, Ergocalciferol, (DRISDOL) 50000 units CAPS capsule Take 50,000 Units by mouth every 30 (thirty) days.   Yes [provider]  acetaminophen (TYLENOL) 500 MG tablet Take 500 mg by mouth every 6 (six) hours as needed.    [provider]  denosumab (PROLIA) 60 MG/ML SOSY injection Inject 60 mg into the skin every 6 (six) months.    [provider]  glucose blood test strip  02/20/14   [provider]  Lactobacillus (DIGESTIVE HEALTH PROBIOTIC) CAPS Take 1 capsule by mouth daily.     [provider]  traMADol (ULTRAM) 50 MG tablet Take by mouth every 6 (six) hours as needed.    [provider]    Allergies Codeine, Valsartan, and Nitrofurantoin monohyd macro  Family History  Problem Relation Age of Onset   Diabetes Son        insulin dependent   Arthritis Mother    Breast cancer Neg Hx     Social History Social History    Tobacco Use   Smoking status: Former Smoker    Types: Cigarettes    Quit date: 02/24/1971    Years since quitting: 47.7   Smokeless tobacco: Never Used  Substance Use Topics   Alcohol use: Yes    Comment: Occasional beer   Drug use: No    Review of Systems  Review of Systems  Constitutional: Positive for fatigue. Negative for fever.  HENT: Negative for congestion and sore throat.   Eyes: Negative for visual disturbance.  Respiratory: Negative for cough and shortness of breath.   Cardiovascular: Negative for chest pain.  Gastrointestinal: Negative for abdominal pain, diarrhea, nausea and vomiting.  Genitourinary: Negative for flank pain.  Musculoskeletal: Positive for back pain. Negative for neck pain.  Skin: Negative for rash and wound.  Neurological: Positive for syncope. Negative for weakness.     ____________________________________________  PHYSICAL EXAM:  VITAL SIGNS: ED Triage Vitals  Enc Vitals Group     BP --      Pulse --      Resp --      Temp --      Temp src --      SpO2 11/11/18 1919 94 %     Weight 11/11/18 1926 115 lb (52.2 kg)     Height 11/11/18 1926 5\' 7"  (1.702 m)     Head Circumference --      Peak Flow --      Pain Score 11/11/18 1925 3     Pain Loc --      Pain Edu? --      Excl. in [place]? --      Physical Exam Vitals signs and nursing note reviewed.  Constitutional:      General: She is not in acute distress.    Appearance: She is well-developed.  HENT:     Head: Normocephalic and atraumatic.  Eyes:     Conjunctiva/sclera: Conjunctivae normal.  Neck:     Musculoskeletal: Neck supple.  Cardiovascular:     Rate and Rhythm: Normal rate and regular rhythm.     Heart sounds: Normal heart sounds. No murmur. No friction rub.     Comments: No appreciable murmur Pulmonary:     Effort: Pulmonary effort is normal. No respiratory distress.     Breath sounds: Normal breath sounds. No wheezing or rales.  Abdominal:     General:  There is no distension.     Palpations: Abdomen is soft.     Tenderness: There is no abdominal tenderness.  Musculoskeletal:     Right lower leg: No edema.     Left lower leg: No edema.     Comments: No leg swelling or calf tenderness  Skin:    General: Skin is warm.     Capillary Refill: Capillary refill takes less than 2 seconds.     Comments: Lumbar surgical site is clean, dry, intact.  Neurological:     Mental Status: She is alert and oriented to person, place, and time.     GCS: GCS eye subscore is 4. GCS verbal subscore is 5. GCS motor subscore is 6.     Cranial Nerves: Cranial nerves are intact.     Sensory: Sensation is intact.     Motor: Motor function is intact. No abnormal muscle tone.     Coordination: Coordination is intact.       ____________________________________________   LABS (all labs ordered are listed, but only abnormal results are displayed)  Labs Reviewed  BASIC METABOLIC PANEL - Abnormal; Notable for the following components:      Result Value   Sodium 129 (*)    Chloride 92 (*)    Calcium 8.8 (*)    All other components within normal limits  GLUCOSE, CAPILLARY - Abnormal; Notable for the following components:   Glucose-Capillary 48 (*)    All other components within normal limits  CBC  URINALYSIS, COMPLETE (UACMP) WITH MICROSCOPIC  CBG MONITORING, ED  TROPONIN I (HIGH SENSITIVITY)    ____________________________________________  EKG: Sinus bradycardia, ventricular rate 51.  No acute ischemic changes.  No ST or T-segment elevation or depressions. ________________________________________  RADIOLOGY All imaging, including plain films, CT scans, and ultrasounds, independently reviewed by me, and interpretations confirmed via formal radiology reads.  ED MD interpretation:   Chest x-ray: Negative  Official radiology report(s): Dg Chest Portable 1 View  Result Date: 11/11/2018 CLINICAL  DATA:  Syncope EXAM: PORTABLE CHEST 1 VIEW COMPARISON:   04/12/2018, 07/05/2012 FINDINGS: Hyperinflated lungs without focal airspace disease or effusion. Stable cardiomediastinal silhouette. No pneumothorax. IMPRESSION: No active disease. Electronically Signed   By: Donavan Foil M.D.   On: 11/11/2018 20:21    ____________________________________________  PROCEDURES   Procedure(s) performed (including Critical Care):  Procedures  ____________________________________________  INITIAL IMPRESSION / MDM / [place] / ED COURSE  As part of my medical decision making, I reviewed the following data within the [place] was evaluated in Emergency Department on 11/11/2018 for the symptoms described in the history of present illness. She was evaluated in the context of the global COVID-19 pandemic, which necessitated consideration that the patient might be at risk for infection with the SARS-CoV-2 virus that causes COVID-19. Institutional protocols and algorithms that pertain to the evaluation of patients at risk for COVID-19 are in a state of rapid change based on information released by regulatory bodies including the CDC and federal and state organizations. These policies and algorithms were followed during the patient's care in the ED.  Some ED evaluations and interventions may be delayed as a result of limited staffing during the pandemic.*   Clinical Course as of Nov 11 2126  Thu Nov 11, 2018  2010 77 yo F here with syncope in setting of recent operation. H/o vasovagal syncope. No coronary history. Pain is resolved now. She had no symptoms. Suspect recurrent vasovagal syncope 2/2 pain, recent surgery w/ possible component of dehydration as well. She had an appropriate prodrome. CBG was wnl. No signs of high risk features such as CHF, anemia, or focal neuro deficits. Check labs, start fluids, and will reassess.   [CI]  2125 Labs show mild hyopnatremia likely 2/2 dehydration/hypovolemia. Remains HDS. UA,  Trop pending. Pt feels better. Will plan to f/u UA, Trop.    [CI]  2126 Suspect vasovagal syncope provoked by pain. Will have her start scheduled APAP in addition to pain meds as needed, along with good return precautions. Will also hve her hold her AM metoprolol dose givne her bradycardia, and to call her PCP to discuss tomorrow AM.   [CI]    Clinical Course User Index [CI] Duffy Bruce, MD    Medical Decision Making: As above. Had a long discussion with pt, daughter re: admission vs d/c. Discussed benefits of observation in terms of observing arrhythmias, hypoglycemia, repeat labs, etc. And pt would prefer outpt management if labs unremarkable a this time.  Patient care transferred to Dr. Joan Mayans at the end of my shift. Patient presentation, ED course, and plan of care discussed with review of all pertinent labs and imaging. Please see his/her note for further details regarding further ED course and disposition.   ____________________________________________  FINAL CLINICAL IMPRESSION(S) / ED DIAGNOSES  Final diagnoses:  Vasovagal syncope  Bradycardia     MEDICATIONS GIVEN DURING THIS VISIT:  Medications  fentaNYL (SUBLIMAZE) injection 25 mcg (0 mcg Intravenous Hold 11/11/18 2112)  acetaminophen (TYLENOL) tablet 650 mg (650 mg Oral Given 11/11/18 2020)  sodium chloride 0.9 % bolus 1,000 mL (0 mLs Intravenous Stopped 11/11/18 2109)     ED Discharge Orders    None       Note:  This document was prepared using Dragon voice recognition software and may include unintentional dictation errors.   Duffy Bruce, MD 11/11/18 2129    Duffy Bruce, MD 11/11/18 2245

## 2018-11-11 NOTE — Discharge Instructions (Signed)

## 2018-11-12 NOTE — Progress Notes (Signed)
Occupational Therapy Evaluation (late entry)  Patient evaluated by Occupational Therapy with no further acute OT needs identified. All education has been completed and the patient has no further questions. All instruction completed.See below for any follow-up Occupational Therapy or equipment needs. OT is signing off. Thank you for this referral.  No follow up recommendations    11/11/18 1230  OT Visit Information  Last OT Received On 11/11/18  Assistance Needed +1  History of Present Illness Pt is a 78 y/o female s/p L2-5 laminectomy. PMH includes DM, HTN, CVA.   Precautions  Precautions Back  Precaution Booklet Issued Yes (comment)  Precaution Comments Reviewed back precautions with pt.   Home Living  Family/patient expects to be discharged to: Private residence  Living Arrangements Spouse/significant other  Available Help at Discharge Family;Available 24 hours/day  Type of [place] to enter  Entrance Stairs-Number of Steps 2  Entrance Stairs-Rails None  Home Layout One level  Bathroom Shower/Tub Walk-in shower  Bathroom Toilet Handicapped height  Home Equipment Walker - 2 wheels  Prior Function  Level of Independence Independent  Communication  Communication No difficulties  Pain Assessment  Pain Assessment Faces  Faces Pain Scale 2  Pain Location back and L thigh  Pain Descriptors / Indicators Operative site guarding  Pain Intervention(s) Monitored during session  Cognition  Arousal/Alertness Awake/alert  Behavior During Therapy WFL for tasks assessed/performed  Overall Cognitive Status Within Functional Limits for tasks assessed  Upper Extremity Assessment  Upper Extremity Assessment Overall WFL for tasks assessed  Lower Extremity Assessment  Lower Extremity Assessment Defer to PT evaluation  Cervical / Trunk Assessment  Cervical / Trunk Assessment Other exceptions  Cervical / Trunk Exceptions s/p lumbar surgery   ADL  Overall ADL's  Needs  assistance/impaired  Eating/Feeding Independent  Grooming Wash/dry hands;Wash/dry face;Oral care;Brushing hair;Supervision/safety;Standing  Grooming Details (indicate cue type and reason) reviewed safe technique for oral care   Upper Body Bathing Set up;Sitting  Lower Body Bathing Supervison/ safety;Sit to/from stand  Lower Body Bathing Details (indicate cue type and reason) able to perform figure 4   Upper Body Dressing  Set up  Lower Body Dressing Supervision/safety;Sit to/from stand  Lower Body Dressing Details (indicate cue type and reason) able to perform figure 4   Toilet Transfer Supervision/safety;Ambulation;Comfort height toilet  Toileting- Clothing Manipulation and Hygiene Supervision/safety  Tub/ IT consultant shower;Supervision/safety;Ambulation  Functional mobility during ADLs Supervision/safety  General ADL Comments reviewed safe technique for ADLs and IADLs  Bed Mobility  Overal bed mobility Modified Independent  General bed mobility comments demonstrates good technique   Transfers  Overall transfer level Needs assistance  Equipment used None  Transfers Sit to/from Stand;Stand Pivot Transfers  Sit to Stand Supervision  Stand pivot transfers Supervision  Balance  Overall balance assessment Needs assistance  Sitting-balance support No upper extremity supported;Feet supported  Sitting balance-Leahy Scale Good  Standing balance support No upper extremity supported;During functional activity  Standing balance-Leahy Scale Fair  General Comments  General comments (skin integrity, edema, etc.) Pt demonstrates good understanding of back precautions   OT - End of Session  Activity Tolerance Patient tolerated treatment well  Patient left in bed;with call bell/phone within reach  Nurse Communication Mobility status  OT Assessment  OT Recommendation/Assessment Patient does not need any further OT services  OT Visit Diagnosis Pain  Pain - part of body  (back )  OT  Problem List Pain;Decreased knowledge of precautions;Decreased knowledge of use of DME or  AE  AM-PAC OT "6 Clicks" Daily Activity Outcome Measure (Version 2)  Help from another person eating meals? 4  Help from another person taking care of personal grooming? 4  Help from another person toileting, which includes using toliet, bedpan, or urinal? 4  Help from another person bathing (including washing, rinsing, drying)? 3  Help from another person to put on and taking off regular upper body clothing? 3  Help from another person to put on and taking off regular lower body clothing? 3  6 Click Score 21  OT Recommendation  Follow Up Recommendations No OT follow up;Supervision - Intermittent  OT Equipment None recommended by OT  Acute Rehab OT Goals  Patient Stated Goal To get back to the gym   OT Goal Formulation All assessment and education complete, DC therapy  OT Time Calculation  OT Start Time (ACUTE ONLY) 0939  OT Stop Time (ACUTE ONLY) 1000  OT Time Calculation (min) 21 min  OT General Charges  $OT Visit 1 Visit  OT Evaluation  $OT Eval Low Complexity 1 Low  Lucille Passy, OTR/L Acute Rehabilitation Services Pager [phone] Office [phone]

## 2018-11-15 DIAGNOSIS — R55 Syncope and collapse: Secondary | ICD-10-CM | POA: Diagnosis not present

## 2018-11-15 DIAGNOSIS — Z23 Encounter for immunization: Secondary | ICD-10-CM | POA: Diagnosis not present

## 2018-11-15 DIAGNOSIS — E1159 Type 2 diabetes mellitus with other circulatory complications: Secondary | ICD-10-CM | POA: Diagnosis not present

## 2018-11-15 DIAGNOSIS — I10 Essential (primary) hypertension: Secondary | ICD-10-CM | POA: Diagnosis not present

## 2018-11-24 DIAGNOSIS — E1069 Type 1 diabetes mellitus with other specified complication: Secondary | ICD-10-CM | POA: Diagnosis not present

## 2018-11-24 DIAGNOSIS — M810 Age-related osteoporosis without current pathological fracture: Secondary | ICD-10-CM | POA: Diagnosis not present

## 2018-11-24 DIAGNOSIS — E785 Hyperlipidemia, unspecified: Secondary | ICD-10-CM | POA: Diagnosis not present

## 2018-11-24 DIAGNOSIS — E109 Type 1 diabetes mellitus without complications: Secondary | ICD-10-CM | POA: Diagnosis not present

## 2018-11-24 DIAGNOSIS — I10 Essential (primary) hypertension: Secondary | ICD-10-CM | POA: Diagnosis not present

## 2018-11-24 DIAGNOSIS — E1159 Type 2 diabetes mellitus with other circulatory complications: Secondary | ICD-10-CM | POA: Diagnosis not present

## 2018-12-15 DIAGNOSIS — L570 Actinic keratosis: Secondary | ICD-10-CM | POA: Diagnosis not present

## 2018-12-15 DIAGNOSIS — Z85828 Personal history of other malignant neoplasm of skin: Secondary | ICD-10-CM | POA: Diagnosis not present

## 2018-12-30 DIAGNOSIS — Z79899 Other long term (current) drug therapy: Secondary | ICD-10-CM | POA: Diagnosis not present

## 2018-12-30 DIAGNOSIS — E1069 Type 1 diabetes mellitus with other specified complication: Secondary | ICD-10-CM | POA: Diagnosis not present

## 2018-12-30 DIAGNOSIS — E785 Hyperlipidemia, unspecified: Secondary | ICD-10-CM | POA: Diagnosis not present

## 2019-01-06 DIAGNOSIS — Z79899 Other long term (current) drug therapy: Secondary | ICD-10-CM | POA: Diagnosis not present

## 2019-01-06 DIAGNOSIS — E109 Type 1 diabetes mellitus without complications: Secondary | ICD-10-CM | POA: Diagnosis not present

## 2019-01-06 DIAGNOSIS — E1059 Type 1 diabetes mellitus with other circulatory complications: Secondary | ICD-10-CM | POA: Diagnosis not present

## 2019-01-06 DIAGNOSIS — M5416 Radiculopathy, lumbar region: Secondary | ICD-10-CM | POA: Diagnosis not present

## 2019-01-06 DIAGNOSIS — I10 Essential (primary) hypertension: Secondary | ICD-10-CM | POA: Diagnosis not present

## 2019-02-22 DIAGNOSIS — M810 Age-related osteoporosis without current pathological fracture: Secondary | ICD-10-CM | POA: Diagnosis not present

## 2019-03-29 DIAGNOSIS — M48062 Spinal stenosis, lumbar region with neurogenic claudication: Secondary | ICD-10-CM | POA: Diagnosis not present

## 2019-03-29 DIAGNOSIS — M4180 Other forms of scoliosis, site unspecified: Secondary | ICD-10-CM | POA: Diagnosis not present

## 2019-04-06 DIAGNOSIS — I10 Essential (primary) hypertension: Secondary | ICD-10-CM | POA: Diagnosis not present

## 2019-04-06 DIAGNOSIS — E1069 Type 1 diabetes mellitus with other specified complication: Secondary | ICD-10-CM | POA: Diagnosis not present

## 2019-04-06 DIAGNOSIS — E109 Type 1 diabetes mellitus without complications: Secondary | ICD-10-CM | POA: Diagnosis not present

## 2019-04-06 DIAGNOSIS — E1159 Type 2 diabetes mellitus with other circulatory complications: Secondary | ICD-10-CM | POA: Diagnosis not present

## 2019-04-06 DIAGNOSIS — E785 Hyperlipidemia, unspecified: Secondary | ICD-10-CM | POA: Diagnosis not present

## 2019-04-06 DIAGNOSIS — M810 Age-related osteoporosis without current pathological fracture: Secondary | ICD-10-CM | POA: Diagnosis not present

## 2019-04-07 DIAGNOSIS — M961 Postlaminectomy syndrome, not elsewhere classified: Secondary | ICD-10-CM | POA: Diagnosis not present

## 2019-04-07 DIAGNOSIS — M4147 Neuromuscular scoliosis, lumbosacral region: Secondary | ICD-10-CM | POA: Diagnosis not present

## 2019-04-07 DIAGNOSIS — M545 Low back pain: Secondary | ICD-10-CM | POA: Diagnosis not present

## 2019-04-07 DIAGNOSIS — M4807 Spinal stenosis, lumbosacral region: Secondary | ICD-10-CM | POA: Diagnosis not present

## 2019-04-07 DIAGNOSIS — M79605 Pain in left leg: Secondary | ICD-10-CM | POA: Diagnosis not present

## 2019-04-18 DIAGNOSIS — D1801 Hemangioma of skin and subcutaneous tissue: Secondary | ICD-10-CM | POA: Diagnosis not present

## 2019-04-18 DIAGNOSIS — Z85828 Personal history of other malignant neoplasm of skin: Secondary | ICD-10-CM | POA: Diagnosis not present

## 2019-04-18 DIAGNOSIS — L719 Rosacea, unspecified: Secondary | ICD-10-CM | POA: Diagnosis not present

## 2019-04-18 DIAGNOSIS — L578 Other skin changes due to chronic exposure to nonionizing radiation: Secondary | ICD-10-CM | POA: Diagnosis not present

## 2019-04-18 DIAGNOSIS — L820 Inflamed seborrheic keratosis: Secondary | ICD-10-CM | POA: Diagnosis not present

## 2019-04-18 DIAGNOSIS — L821 Other seborrheic keratosis: Secondary | ICD-10-CM | POA: Diagnosis not present

## 2019-04-18 DIAGNOSIS — D692 Other nonthrombocytopenic purpura: Secondary | ICD-10-CM | POA: Diagnosis not present

## 2019-04-18 DIAGNOSIS — L80 Vitiligo: Secondary | ICD-10-CM | POA: Diagnosis not present

## 2019-04-18 DIAGNOSIS — I781 Nevus, non-neoplastic: Secondary | ICD-10-CM | POA: Diagnosis not present

## 2019-04-18 DIAGNOSIS — L570 Actinic keratosis: Secondary | ICD-10-CM | POA: Diagnosis not present

## 2019-04-18 DIAGNOSIS — Z1283 Encounter for screening for malignant neoplasm of skin: Secondary | ICD-10-CM | POA: Diagnosis not present

## 2019-05-20 DIAGNOSIS — M545 Low back pain: Secondary | ICD-10-CM | POA: Diagnosis not present

## 2019-05-20 DIAGNOSIS — M4147 Neuromuscular scoliosis, lumbosacral region: Secondary | ICD-10-CM | POA: Diagnosis not present

## 2019-05-20 DIAGNOSIS — M79605 Pain in left leg: Secondary | ICD-10-CM | POA: Diagnosis not present

## 2019-05-20 DIAGNOSIS — M961 Postlaminectomy syndrome, not elsewhere classified: Secondary | ICD-10-CM | POA: Diagnosis not present

## 2019-05-20 DIAGNOSIS — M4807 Spinal stenosis, lumbosacral region: Secondary | ICD-10-CM | POA: Diagnosis not present

## 2019-05-30 ENCOUNTER — Ambulatory Visit: Payer: PPO | Admitting: Dermatology

## 2019-05-31 ENCOUNTER — Ambulatory Visit: Payer: PPO | Admitting: Dermatology

## 2019-05-31 ENCOUNTER — Other Ambulatory Visit: Payer: Self-pay

## 2019-05-31 DIAGNOSIS — Z85828 Personal history of other malignant neoplasm of skin: Secondary | ICD-10-CM | POA: Diagnosis not present

## 2019-05-31 DIAGNOSIS — L820 Inflamed seborrheic keratosis: Secondary | ICD-10-CM

## 2019-05-31 DIAGNOSIS — L570 Actinic keratosis: Secondary | ICD-10-CM

## 2019-05-31 DIAGNOSIS — L578 Other skin changes due to chronic exposure to nonionizing radiation: Secondary | ICD-10-CM | POA: Diagnosis not present

## 2019-05-31 DIAGNOSIS — I781 Nevus, non-neoplastic: Secondary | ICD-10-CM

## 2019-05-31 NOTE — Progress Notes (Signed)
   Follow-Up Visit   Subjective  Denise Sherman is a 79 y.o. female who presents for the following: Follow-up (AKs on scalp) and spots (face, some scaly).  She has several spots to check today.  The following portions of the chart were reviewed this encounter and updated as appropriate:     Review of Systems: No other skin or systemic complaints.  Objective  Well appearing patient in no apparent distress; mood and affect are within normal limits.  A focused examination was performed including face and scalp. Relevant physical exam findings are noted in the Assessment and Plan.  Objective  Scalp x 2, upper and lower lip x 2, R nose x 1 (5): Erythematous thin papules/macules with gritty scale.   Objective  Left superior forehead: Well healed scar with no evidence of recurrence.   Objective  Left Lower Eyelid : Erythematous keratotic or waxy stuck-on papule or plaque.   Objective  Right nose: Telangiectatic patch.  Assessment & Plan   Actinic Damage - diffuse scaly erythematous macules with underlying dyspigmentation - Recommend daily broad spectrum sunscreen SPF 30+ to sun-exposed areas, reapply every 2 hours as needed.  - Call for new or changing lesions.  AK (actinic keratosis) (5) Scalp x 2, upper and lower lip x 2, R nose x 1  Destruction of lesion - Scalp x 2, upper and lower lip x 2, R nose x 1 Complexity: simple   Destruction method: cryotherapy   Informed consent: discussed and consent obtained   Timeout:  patient name, date of birth, surgical site, and procedure verified Lesion destroyed using liquid nitrogen: Yes   Region frozen until ice ball extended beyond lesion: Yes   Outcome: patient tolerated procedure well with no complications   Post-procedure details: wound care instructions given    History of basal cell carcinoma (BCC) Left superior forehead  Clear. Observe for recurrence. Call clinic for new or changing lesions.  Recommend regular skin exams,  daily broad-spectrum spf 30+ sunscreen use, and photoprotection.     Inflamed seborrheic keratosis Left Lower Eyelid   Destruction of lesion - Left Lower Eyelid  Complexity: simple   Destruction method: cryotherapy   Informed consent: discussed and consent obtained   Timeout:  patient name, date of birth, surgical site, and procedure verified Lesion destroyed using liquid nitrogen: Yes   Region frozen until ice ball extended beyond lesion: Yes   Outcome: patient tolerated procedure well with no complications   Post-procedure details: wound care instructions given    Telangiectasias Right nose  Benign, observe.    Return in about 3 months (around 08/30/2019) for AKs - scalp, face.   Lindi Adie, CMA, am acting as scribe for Sarina Ser, MD .

## 2019-06-13 IMAGING — MR MR LUMBAR SPINE W/O CM
4 of 5 series · 33 of 48 positions shown · non-contrast
Comparison: Lumbar MRI 05/09/2015

CLINICAL DATA: Lumbar radiculitis. Low back pain left hip and leg
pain

EXAM:
MRI LUMBAR SPINE WITHOUT CONTRAST
TECHNIQUE: Multiplanar, multisequence MR imaging of the lumbar spine was
performed. No intravenous contrast was administered.

[Series 5: T2 · sagittal · 4.0mm · 0.81mm/px · 8 of 17 slices shown (1 of 2)]
[im 1/17]
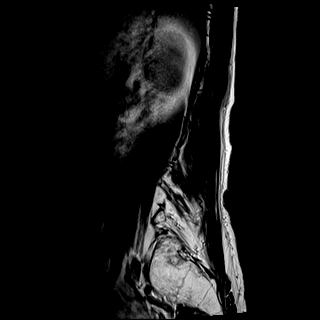
[im 3/17]
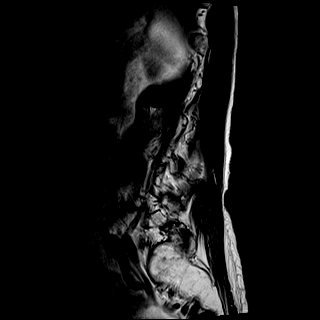
[im 5/17]
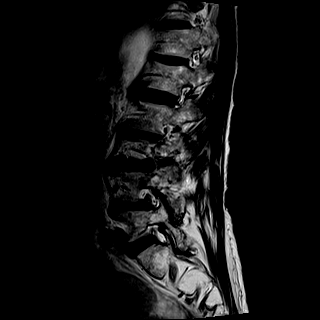
[im 7/17]
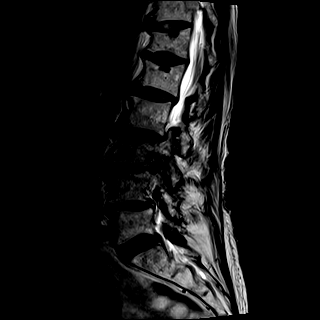
[im 10/17]
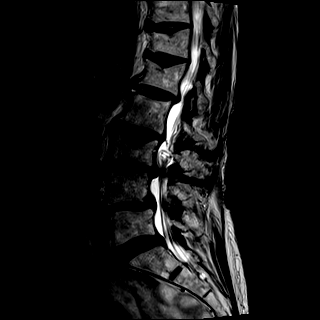
[im 12/17]
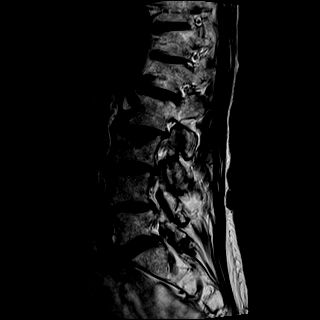
[im 14/17]
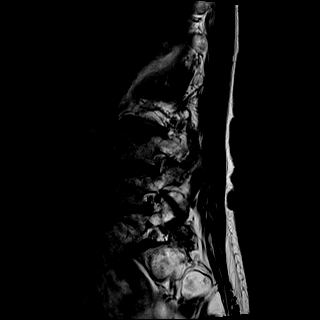
[im 17/17]
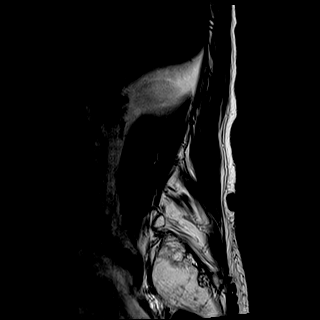

[Series 6: T1 · sagittal · 4.0mm · 0.81mm/px · 7 of 17 slices shown (1 of 2)]
[im 1/17]
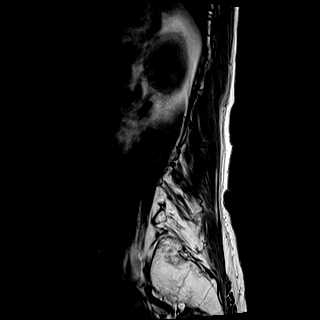
[im 3/17]
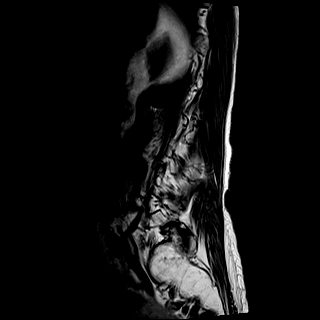
[im 6/17]
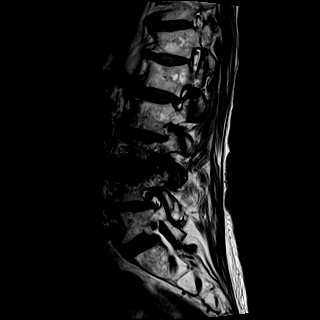
[im 9/17]
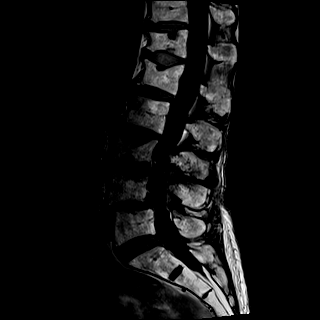
[im 11/17]
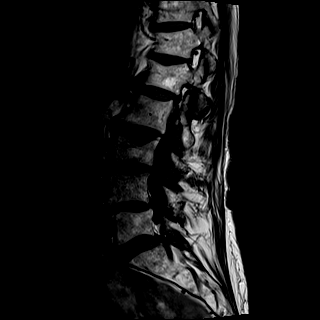
[im 14/17]
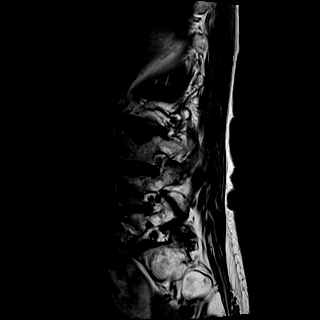
[im 17/17]
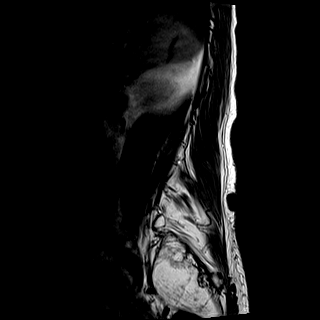

[Series 8: T2 · axial · 4.0mm · 0.78mm/px · z∈[-3,+179]mm · 9 of 29 slices shown (2 of 2)]
[im 1/29]
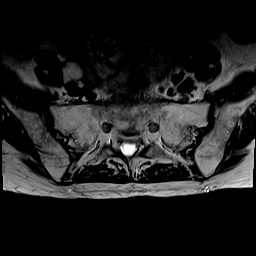
[im 5/29]
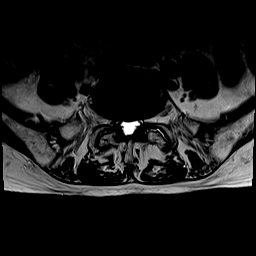
[im 10/29]
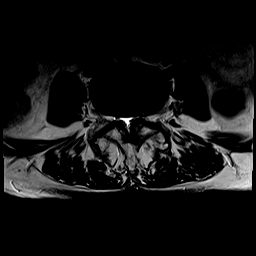
[im 12/29]
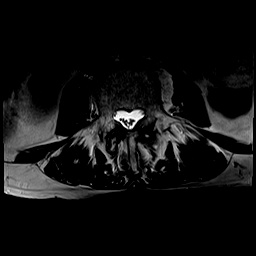
[im 15/29]
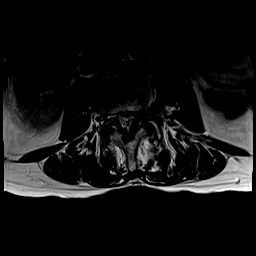
[im 17/29]
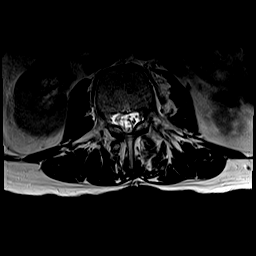
[im 19/29]
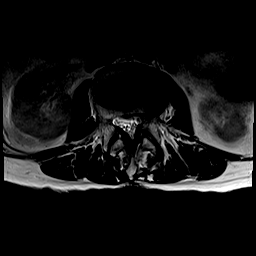
[im 24/29]
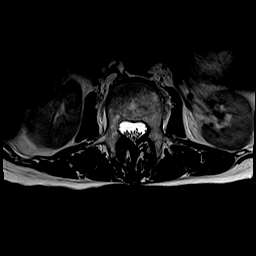
[im 29/29]
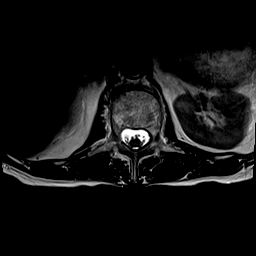

[Series 9: T1 · axial · 4.0mm · 0.39mm/px · z∈[-3,+179]mm · 9 of 29 slices shown (2 of 2)]
[im 1/29]
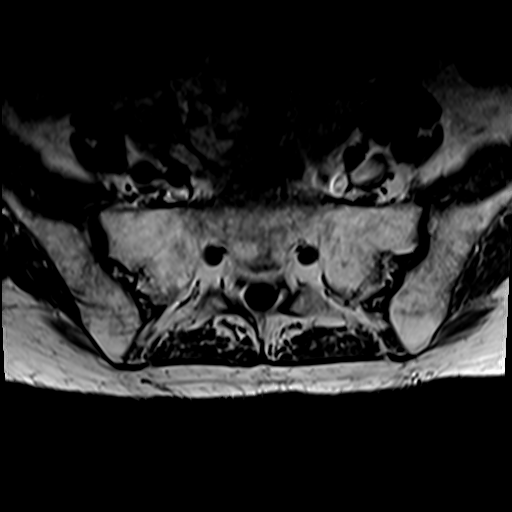
[im 5/29]
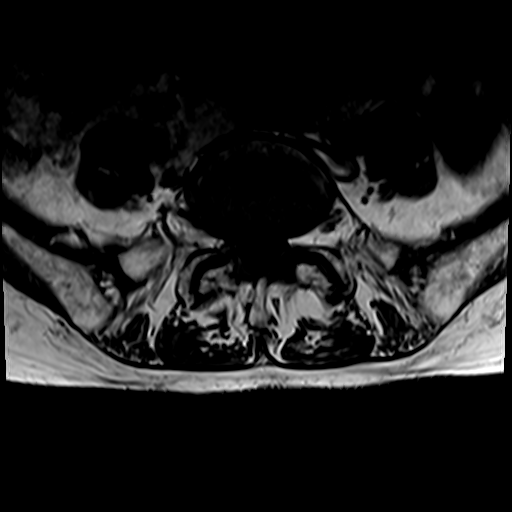
[im 10/29]
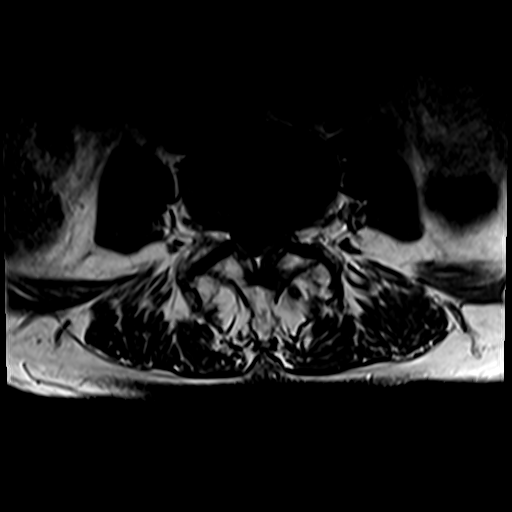
[im 12/29]
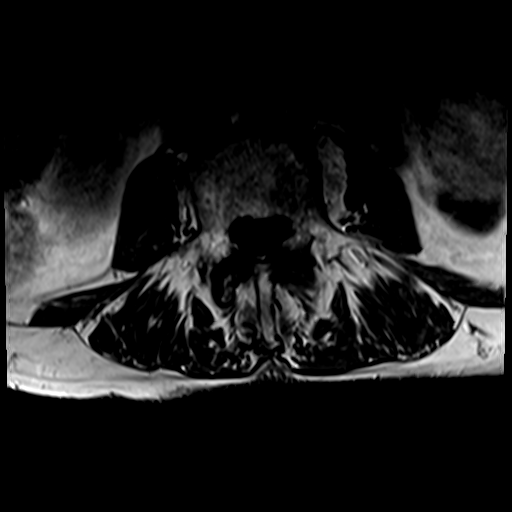
[im 15/29]
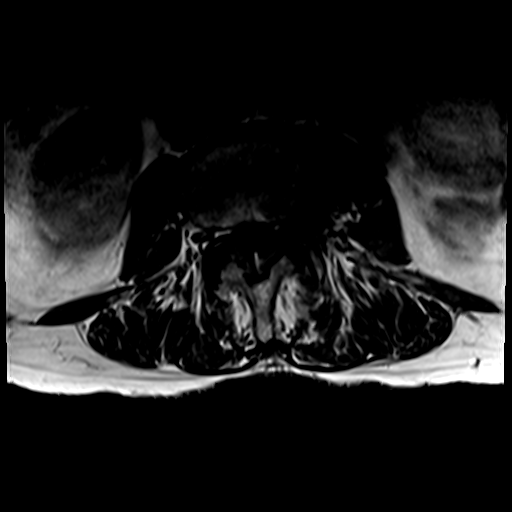
[im 17/29]
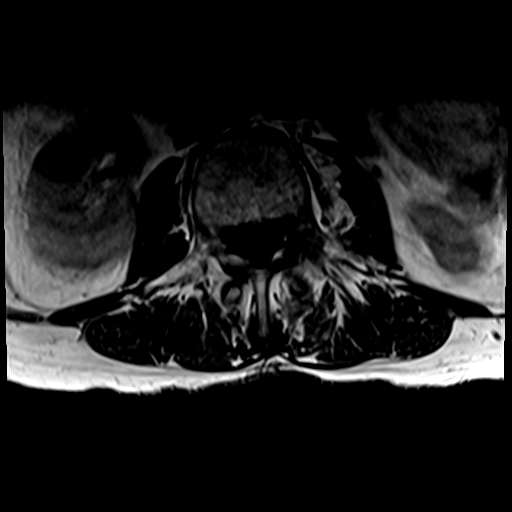
[im 19/29]
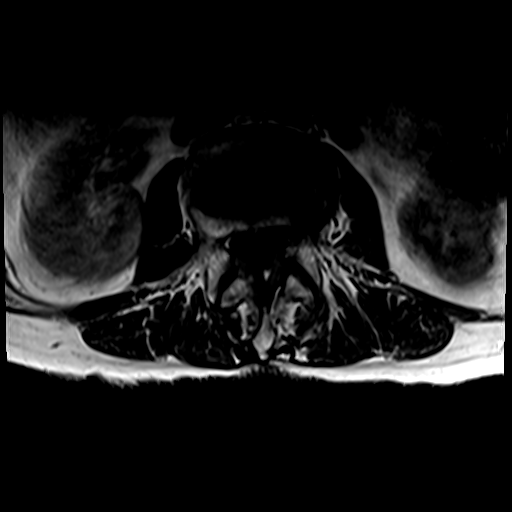
[im 24/29]
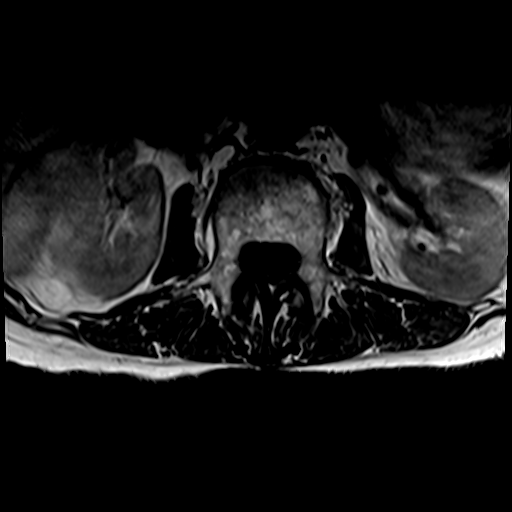
[im 29/29]
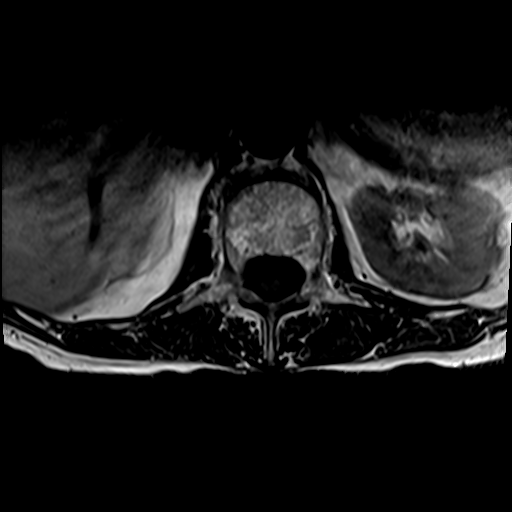

[33 of 48 positions shown; findings below may reference images not displayed]

FINDINGS: Segmentation:  Normal

Alignment: Mild retrolisthesis L1-2, L2-3, L3-4. 5 mm
anterolisthesis L4-5. Slight anterolisthesis L5-S1. Alignment is
unchanged from the prior study.

Vertebrae: Chronic compression fractures T12 and L1 unchanged. No
acute fracture or mass.

Conus medullaris and cauda equina: Conus extends to the L1-2 level.
Conus and cauda equina appear normal.

Paraspinal and other soft tissues: Negative for paraspinous mass or
fluid collection

Disc levels:

L1-2: Mild retrolisthesis. Disc and facet degeneration. Mild
subarticular stenosis bilaterally.

L2-3: Disc degeneration and spurring left greater than right.
Moderate subarticular stenosis on the left unchanged from the prior
study. Mild facet hypertrophy.

L3-4: Large central and left-sided disc protrusion. Severe facet
degeneration. Severe spinal stenosis. Severe subarticular and
foraminal stenosis on the left. Moderate stenosis on the right.

L4-5: Mild anterolisthesis. Disc bulging with central disc
protrusion unchanged. Moderate facet and ligamentum flavum
hypertrophy. Moderate spinal stenosis unchanged. Moderate
subarticular stenosis bilaterally

L5-S1: Mild disc bulging. Moderate facet hypertrophy. Moderate
subarticular stenosis on the right.
IMPRESSION: Advanced multilevel degenerative change throughout the lumbar spine.
Chronic fractures T12 and L1

Severe spinal stenosis L3-4 with large central disc protrusion
unchanged from the prior study. Severe subarticular and foraminal
stenosis on the left.

Moderate spinal stenosis L4-5 with moderate subarticular stenosis
bilaterally.

## 2019-06-22 ENCOUNTER — Other Ambulatory Visit: Payer: Self-pay

## 2019-06-22 ENCOUNTER — Ambulatory Visit: Payer: PPO | Admitting: Dermatology

## 2019-06-22 DIAGNOSIS — L300 Nummular dermatitis: Secondary | ICD-10-CM

## 2019-06-22 NOTE — Progress Notes (Signed)
   Follow-Up Visit   Subjective  Denise Sherman is a 79 y.o. female who presents for the following: lesion (R lower leg x 3 weeks - growing larger, patient currently using Neosporin but hasn't noticed an improvement).  The following portions of the chart were reviewed this encounter and updated as appropriate:  Tobacco  Allergies  Meds  Problems  Med Hx  Surg Hx  Fam Hx     Review of Systems:  No other skin or systemic complaints except as noted in HPI or Assessment and Plan.  Objective  Well appearing patient in no apparent distress; mood and affect are within normal limits.  A focused examination was performed including the right lower leg. Relevant physical exam findings are noted in the Assessment and Plan.  Objective  R lower leg: 2.2 cm pink scaly plaque  Images      Assessment & Plan  Nummular eczema R lower leg  Vs psoriasis vs porokeratosis -  Samples given of Ultravate ointment apply to aa's BID x 2 weeks. If improving continue until clear. If not, contact our office for further instruction.  Return for appointment as scheduled.  Luther Redo, CMA, am acting as scribe for Sarina Ser, MD .  Documentation: I have reviewed the above documentation for accuracy and completeness, and I agree with the above.  Sarina Ser, MD

## 2019-06-25 ENCOUNTER — Encounter: Payer: Self-pay | Admitting: Dermatology

## 2019-06-30 DIAGNOSIS — E109 Type 1 diabetes mellitus without complications: Secondary | ICD-10-CM | POA: Diagnosis not present

## 2019-06-30 DIAGNOSIS — Z79899 Other long term (current) drug therapy: Secondary | ICD-10-CM | POA: Diagnosis not present

## 2019-07-07 DIAGNOSIS — Z1331 Encounter for screening for depression: Secondary | ICD-10-CM | POA: Diagnosis not present

## 2019-07-07 DIAGNOSIS — Z0000 Encounter for general adult medical examination without abnormal findings: Secondary | ICD-10-CM | POA: Diagnosis not present

## 2019-07-07 DIAGNOSIS — Z79899 Other long term (current) drug therapy: Secondary | ICD-10-CM | POA: Diagnosis not present

## 2019-07-08 DIAGNOSIS — M545 Low back pain: Secondary | ICD-10-CM | POA: Diagnosis not present

## 2019-07-08 DIAGNOSIS — M4147 Neuromuscular scoliosis, lumbosacral region: Secondary | ICD-10-CM | POA: Diagnosis not present

## 2019-07-08 DIAGNOSIS — M4807 Spinal stenosis, lumbosacral region: Secondary | ICD-10-CM | POA: Diagnosis not present

## 2019-07-08 DIAGNOSIS — M961 Postlaminectomy syndrome, not elsewhere classified: Secondary | ICD-10-CM | POA: Diagnosis not present

## 2019-07-08 DIAGNOSIS — M79605 Pain in left leg: Secondary | ICD-10-CM | POA: Diagnosis not present

## 2019-08-19 DIAGNOSIS — Z9889 Other specified postprocedural states: Secondary | ICD-10-CM | POA: Diagnosis not present

## 2019-08-19 DIAGNOSIS — M4180 Other forms of scoliosis, site unspecified: Secondary | ICD-10-CM | POA: Diagnosis not present

## 2019-08-30 DIAGNOSIS — M810 Age-related osteoporosis without current pathological fracture: Secondary | ICD-10-CM | POA: Diagnosis not present

## 2019-09-08 ENCOUNTER — Ambulatory Visit: Payer: PPO | Admitting: Dermatology

## 2019-09-08 ENCOUNTER — Other Ambulatory Visit: Payer: Self-pay

## 2019-09-08 DIAGNOSIS — L709 Acne, unspecified: Secondary | ICD-10-CM | POA: Diagnosis not present

## 2019-09-08 DIAGNOSIS — L578 Other skin changes due to chronic exposure to nonionizing radiation: Secondary | ICD-10-CM

## 2019-09-08 DIAGNOSIS — L300 Nummular dermatitis: Secondary | ICD-10-CM

## 2019-09-08 NOTE — Patient Instructions (Addendum)
Recommend daily broad spectrum sunscreen SPF 30+ to sun-exposed areas, reapply every 2 hours as needed. Call for new or changing lesions.  

## 2019-09-08 NOTE — Progress Notes (Signed)
   Follow-Up Visit   Subjective  Denise Sherman is a 79 y.o. female who presents for the following: Follow-up.  Patient presents today to follow up on Left lower leg, Nummular Eczema vs psoriasis vs porokeratosis. Used Ultravate cream till cleared. Patient also has an area of concern on her Right  temple she would like to have evaluated today  The following portions of the chart were reviewed this encounter and updated as appropriate:  Tobacco  Allergies  Meds  Problems  Med Hx  Surg Hx  Fam Hx     Review of Systems:  No other skin or systemic complaints except as noted in HPI or Assessment and Plan.  Objective  Well appearing patient in no apparent distress; mood and affect are within normal limits.  A focused examination was performed including Left lower legs, left temple area. Relevant physical exam findings are noted in the Assessment and Plan.  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  Acne, unspecified acne type Right Frontal Scalp  Give sample of EpiDuo Forte, use small amount to affected area till clear  Actinic Damage - diffuse scaly erythematous macules with underlying dyspigmentation - Recommend daily broad spectrum sunscreen SPF 30+ to sun-exposed areas, reapply every 2 hours as needed.  - Call for new or changing lesions.  Return for: Pt Will call if area of leg gets red.  Marene Lenz, CMA, am acting as scribe for Sarina Ser, MD . Documentation: I have reviewed the above documentation for accuracy and completeness, and I agree with the above.  Sarina Ser, MD

## 2019-09-12 ENCOUNTER — Encounter: Payer: Self-pay | Admitting: Dermatology

## 2019-09-27 DIAGNOSIS — S00411A Abrasion of right ear, initial encounter: Secondary | ICD-10-CM | POA: Diagnosis not present

## 2019-09-27 DIAGNOSIS — H9201 Otalgia, right ear: Secondary | ICD-10-CM | POA: Diagnosis not present

## 2019-09-27 DIAGNOSIS — H6192 Disorder of left external ear, unspecified: Secondary | ICD-10-CM | POA: Diagnosis not present

## 2019-09-27 DIAGNOSIS — H9211 Otorrhea, right ear: Secondary | ICD-10-CM | POA: Diagnosis not present

## 2019-10-03 IMAGING — CR DG C-ARM 61-120 MIN
1 series · 1 of 1 positions shown · non-contrast
Comparison: None.

CLINICAL DATA: Kyphoplasty L2.

EXAM:
LUMBAR SPINE - 2-3 VIEW; DG C-ARM 61-120 MIN

[m1]
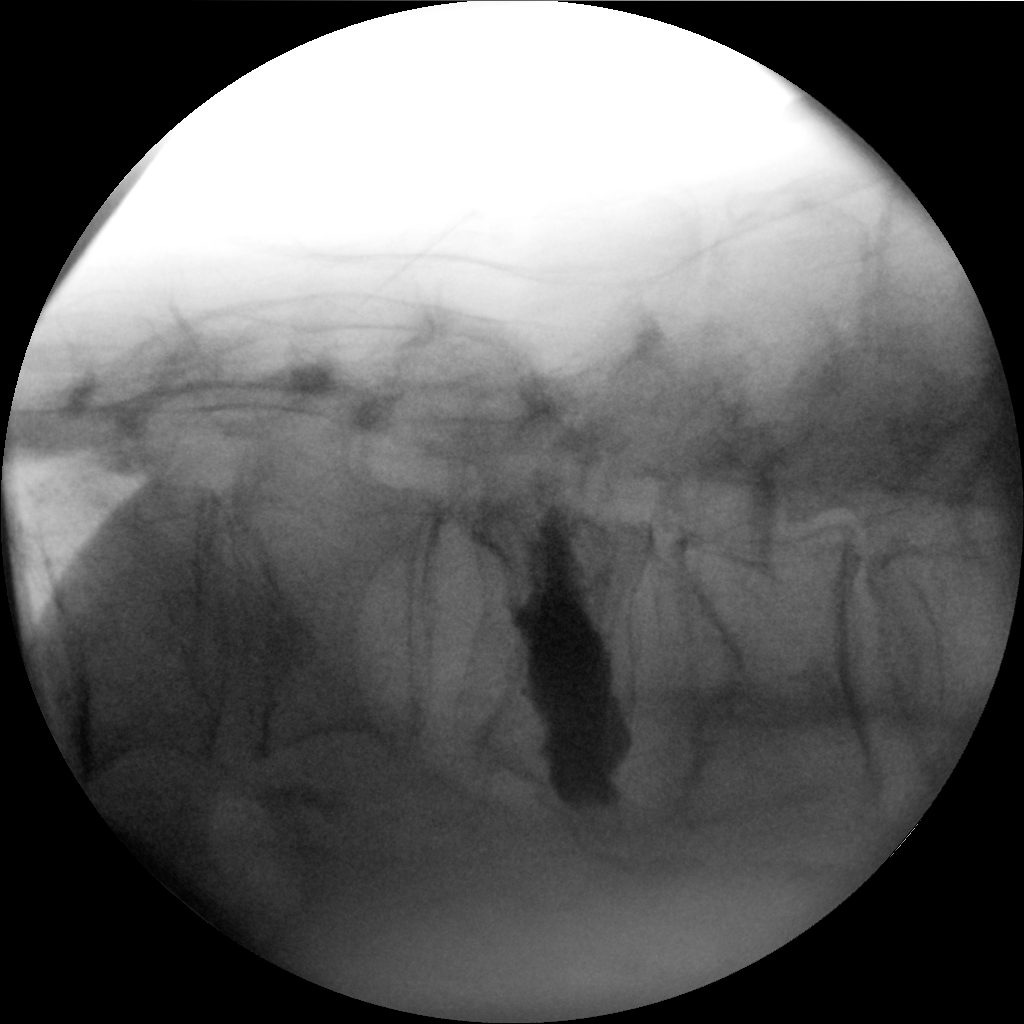

[1 of 1 positions shown; findings below may reference images not displayed]

FINDINGS: Two intraoperative fluoroscopic images of the lumbar spine are
provided. Kyphoplasty material in place. No evidence of extruded
cement.

Fluoroscopy was provided for 1 minutes 57 seconds.
IMPRESSION: Intraoperative images obtained during kyphoplasty. No evidence of
complicating feature.

## 2019-10-13 DIAGNOSIS — S00411A Abrasion of right ear, initial encounter: Secondary | ICD-10-CM | POA: Diagnosis not present

## 2019-10-13 DIAGNOSIS — H6060 Unspecified chronic otitis externa, unspecified ear: Secondary | ICD-10-CM | POA: Diagnosis not present

## 2019-11-04 DIAGNOSIS — E119 Type 2 diabetes mellitus without complications: Secondary | ICD-10-CM | POA: Diagnosis not present

## 2019-11-07 ENCOUNTER — Other Ambulatory Visit: Payer: Self-pay | Admitting: Family Medicine

## 2019-11-07 DIAGNOSIS — Z1231 Encounter for screening mammogram for malignant neoplasm of breast: Secondary | ICD-10-CM

## 2019-11-14 DIAGNOSIS — H6123 Impacted cerumen, bilateral: Secondary | ICD-10-CM | POA: Diagnosis not present

## 2019-11-14 DIAGNOSIS — H6061 Unspecified chronic otitis externa, right ear: Secondary | ICD-10-CM | POA: Diagnosis not present

## 2019-11-14 DIAGNOSIS — H903 Sensorineural hearing loss, bilateral: Secondary | ICD-10-CM | POA: Diagnosis not present

## 2019-11-23 ENCOUNTER — Other Ambulatory Visit: Payer: Self-pay

## 2019-11-23 ENCOUNTER — Ambulatory Visit
Admission: RE | Admit: 2019-11-23 | Discharge: 2019-11-23 | Disposition: A | Discharge status: Home / Self Care | Payer: PPO | Source: Ambulatory Visit | Attending: Family Medicine | Admitting: Family Medicine

## 2019-11-23 DIAGNOSIS — Z1231 Encounter for screening mammogram for malignant neoplasm of breast: Secondary | ICD-10-CM | POA: Diagnosis not present

## 2019-12-21 DIAGNOSIS — Z23 Encounter for immunization: Secondary | ICD-10-CM | POA: Diagnosis not present

## 2019-12-28 DIAGNOSIS — M810 Age-related osteoporosis without current pathological fracture: Secondary | ICD-10-CM | POA: Diagnosis not present

## 2019-12-28 DIAGNOSIS — I152 Hypertension secondary to endocrine disorders: Secondary | ICD-10-CM | POA: Diagnosis not present

## 2019-12-28 DIAGNOSIS — E109 Type 1 diabetes mellitus without complications: Secondary | ICD-10-CM | POA: Diagnosis not present

## 2019-12-28 DIAGNOSIS — E1159 Type 2 diabetes mellitus with other circulatory complications: Secondary | ICD-10-CM | POA: Diagnosis not present

## 2020-01-03 DIAGNOSIS — E109 Type 1 diabetes mellitus without complications: Secondary | ICD-10-CM | POA: Diagnosis not present

## 2020-01-03 DIAGNOSIS — E785 Hyperlipidemia, unspecified: Secondary | ICD-10-CM | POA: Diagnosis not present

## 2020-01-03 DIAGNOSIS — Z79899 Other long term (current) drug therapy: Secondary | ICD-10-CM | POA: Diagnosis not present

## 2020-01-03 DIAGNOSIS — E1069 Type 1 diabetes mellitus with other specified complication: Secondary | ICD-10-CM | POA: Diagnosis not present

## 2020-01-10 DIAGNOSIS — E1069 Type 1 diabetes mellitus with other specified complication: Secondary | ICD-10-CM | POA: Diagnosis not present

## 2020-01-10 DIAGNOSIS — E1142 Type 2 diabetes mellitus with diabetic polyneuropathy: Secondary | ICD-10-CM | POA: Diagnosis not present

## 2020-01-10 DIAGNOSIS — Z79899 Other long term (current) drug therapy: Secondary | ICD-10-CM | POA: Diagnosis not present

## 2020-01-10 DIAGNOSIS — E785 Hyperlipidemia, unspecified: Secondary | ICD-10-CM | POA: Diagnosis not present

## 2020-01-10 DIAGNOSIS — M2012 Hallux valgus (acquired), left foot: Secondary | ICD-10-CM | POA: Diagnosis not present

## 2020-01-10 DIAGNOSIS — E109 Type 1 diabetes mellitus without complications: Secondary | ICD-10-CM | POA: Diagnosis not present

## 2020-01-10 DIAGNOSIS — M79671 Pain in right foot: Secondary | ICD-10-CM | POA: Diagnosis not present

## 2020-01-10 DIAGNOSIS — M2011 Hallux valgus (acquired), right foot: Secondary | ICD-10-CM | POA: Diagnosis not present

## 2020-01-10 DIAGNOSIS — M2041 Other hammer toe(s) (acquired), right foot: Secondary | ICD-10-CM | POA: Diagnosis not present

## 2020-03-06 DIAGNOSIS — M810 Age-related osteoporosis without current pathological fracture: Secondary | ICD-10-CM | POA: Diagnosis not present

## 2020-03-28 ENCOUNTER — Other Ambulatory Visit: Payer: Self-pay | Admitting: Dermatology

## 2020-03-28 ENCOUNTER — Other Ambulatory Visit: Payer: Self-pay

## 2020-03-28 ENCOUNTER — Ambulatory Visit: Payer: HMO | Admitting: Dermatology

## 2020-03-28 ENCOUNTER — Encounter: Payer: Self-pay | Admitting: Dermatology

## 2020-03-28 DIAGNOSIS — L821 Other seborrheic keratosis: Secondary | ICD-10-CM

## 2020-03-28 DIAGNOSIS — D492 Neoplasm of unspecified behavior of bone, soft tissue, and skin: Secondary | ICD-10-CM

## 2020-03-28 DIAGNOSIS — L820 Inflamed seborrheic keratosis: Secondary | ICD-10-CM | POA: Diagnosis not present

## 2020-03-28 DIAGNOSIS — C4401 Basal cell carcinoma of skin of lip: Secondary | ICD-10-CM

## 2020-03-28 DIAGNOSIS — L719 Rosacea, unspecified: Secondary | ICD-10-CM

## 2020-03-28 DIAGNOSIS — L570 Actinic keratosis: Secondary | ICD-10-CM

## 2020-03-28 DIAGNOSIS — Z85828 Personal history of other malignant neoplasm of skin: Secondary | ICD-10-CM | POA: Diagnosis not present

## 2020-03-28 NOTE — Progress Notes (Signed)
Follow-Up Visit   Subjective  Denise Sherman is a 80 y.o. female who presents for the following: check spots (Face, ~14m, one is sore when rubbed).  Pink spots on upper lip are new and don't heal.   The following portions of the chart were reviewed this encounter and updated as appropriate:       Review of Systems:  No other skin or systemic complaints except as noted in HPI or Assessment and Plan.  Objective  Well appearing patient in no apparent distress; mood and affect are within normal limits.  A focused examination was performed including face. Relevant physical exam findings are noted in the Assessment and Plan.  Objective  R paranasal x 1, R nasal dorsum x 1 (2): Pink tan waxy macules  Objective  Nasal dorsum x 1, central upper lip at vermillion x 1 (2): Pink scaly macules   Objective  face: Blanching pink macule L chin- present long time no change, telangiectasias malar cheeks  Objective  L sup forehead: Well healed scar with no evidence of recurrence.   Objective  Left Upper Lip: 3.49mm pink/white slightly pearly pap   Assessment & Plan  Inflamed seborrheic keratosis (2) R paranasal x 1, R nasal dorsum x 1  Destruction of lesion - R paranasal x 1, R nasal dorsum x 1  Destruction method: cryotherapy   Informed consent: discussed and consent obtained   Lesion destroyed using liquid nitrogen: Yes   Region frozen until ice ball extended beyond lesion: Yes   Outcome: patient tolerated procedure well with no complications   Post-procedure details: wound care instructions given    AK (actinic keratosis) (2) Nasal dorsum x 1, central upper lip at vermillion x 1  Destruction of lesion - Nasal dorsum x 1, central upper lip at vermillion x 1  Destruction method: cryotherapy   Informed consent: discussed and consent obtained   Lesion destroyed using liquid nitrogen: Yes   Region frozen until ice ball extended beyond lesion: Yes   Outcome: patient tolerated  procedure well with no complications   Post-procedure details: wound care instructions given    Rosacea face  Chronic condition- controlled  Cont  Permethrin cr qhs Recheck L chin on f/up  Rosacea is a chronic progressive skin condition usually affecting the face of adults, causing redness and/or acne bumps. It is treatable but not curable. It sometimes affects the eyes (ocular rosacea) as well. It may respond to topical and/or systemic medication and can flare with stress, sun exposure, alcohol, exercise and some foods.  Daily application of broad spectrum spf 30+ sunscreen to face is recommended to reduce flares.   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 Observe for recurrence. Call clinic for new or changing lesions.  Recommend regular skin exams, daily broad-spectrum spf 30+ sunscreen use, and photoprotection.     Neoplasm of skin Left Upper Lip  Epidermal / dermal shaving  Lesion diameter (cm):  0.3 Informed consent: discussed and consent obtained   Patient was prepped and draped in usual sterile fashion: area prepped with alcohol. Anesthesia: the lesion was anesthetized in a standard fashion   Anesthetic:  1% lidocaine w/ epinephrine 1-100,000 buffered w/ 8.4% NaHCO3 Instrument used: flexible razor blade   Hemostasis achieved with: pressure, aluminum chloride and electrodesiccation   Outcome: patient tolerated procedure well   Post-procedure details: wound care instructions given   Post-procedure details comment:  Ointment and small bandage applied  Specimen 1 - Surgical pathology Differential  Diagnosis: D48.5 Sebaceous hyperplasia r/o BCC Check Margins: No 3.66mm pink slightly pearly pap  Sebaceous Hyperplasia r/o BCC   Seborrheic Keratoses - Stuck-on, waxy, tan-brown macules/papules face  - Discussed benign etiology and prognosis. - Observe - Call for any changes   Return in about 3 months (around 06/25/2020), or if symptoms worsen or fail to  improve, for Hx of AKs.   I, Othelia Pulling, RMA, am acting as scribe for Brendolyn Patty, MD .  Documentation: I have reviewed the above documentation for accuracy and completeness, and I agree with the above.  Brendolyn Patty MD

## 2020-03-28 NOTE — Patient Instructions (Signed)

## 2020-04-02 ENCOUNTER — Telehealth: Payer: Self-pay

## 2020-04-02 NOTE — Telephone Encounter (Signed)
-----   Message from Brendolyn Patty, MD sent at 04/02/2020  9:01 AM EST ----- Skin , left upper lip BASAL CELL CARCINOMA, SUPERFICIAL AND NODULAR PATTERNS  BCC skin cancer.  Due to its small size, we can EDC to treat (round scar same size as lesion).  Excision is also an option if she prefers (line scar that is at 2-3x length of lesion).

## 2020-04-02 NOTE — Telephone Encounter (Signed)
Advised patient biopsy on the left upper lip was BCC. Discussed treatment options, EDC vs EXC and resulting scar. EDC scheduled for 04/25/20 at 10:00am.

## 2020-04-25 ENCOUNTER — Encounter: Payer: Self-pay | Admitting: Dermatology

## 2020-04-25 ENCOUNTER — Ambulatory Visit: Payer: HMO | Admitting: Dermatology

## 2020-04-25 ENCOUNTER — Other Ambulatory Visit: Payer: Self-pay

## 2020-04-25 DIAGNOSIS — C4401 Basal cell carcinoma of skin of lip: Secondary | ICD-10-CM

## 2020-04-25 DIAGNOSIS — L578 Other skin changes due to chronic exposure to nonionizing radiation: Secondary | ICD-10-CM | POA: Diagnosis not present

## 2020-04-25 DIAGNOSIS — L821 Other seborrheic keratosis: Secondary | ICD-10-CM | POA: Diagnosis not present

## 2020-04-25 NOTE — Progress Notes (Signed)
   Follow-Up Visit   Subjective  Denise Sherman is a 80 y.o. female who presents for the following: Procedure (Patient here for [facility] to [facility] of the left upper lip.) and spot (Left neck, patient would like checked.).   The following portions of the chart were reviewed this encounter and updated as appropriate:       Review of Systems:  No other skin or systemic complaints except as noted in HPI or Assessment and Plan.  Objective  Well appearing patient in no apparent distress; mood and affect are within normal limits.  A focused examination was performed including face, neck. Relevant physical exam findings are noted in the Assessment and Plan.  Objective  Left Upper Lip: Pink biopsy site.  Objective  Left Neck: Waxy flesh white papule.   Assessment & Plan    Actinic Damage - chronic, secondary to cumulative UV radiation exposure/sun exposure over time - diffuse scaly erythematous macules with underlying dyspigmentation - Recommend daily broad spectrum sunscreen SPF 30+ to sun-exposed areas, reapply every 2 hours as needed.  - Call for new or changing lesions.  Basal cell carcinoma (BCC) of skin of lip Left Upper Lip  Destruction of lesion  Destruction method: electrodesiccation and curettage   Informed consent: discussed and consent obtained   Timeout:  patient name, date of birth, surgical site, and procedure verified Procedure prep:  Patient was prepped and draped in usual sterile fashion Prep type:  Isopropyl alcohol Anesthesia: the lesion was anesthetized in a standard fashion   Anesthetic:  1% lidocaine w/ epinephrine 1-100,000 buffered w/ 8.4% NaHCO3 Curettage performed in three different directions: Yes   Electrodesiccation performed over the curetted area: Yes   Lesion length (cm):  0.3 Lesion width (cm):  0.3 Margin per side (cm):  0.1 Final wound size (cm):  0.5 Hemostasis achieved with:  pressure, aluminum chloride and electrodesiccation Outcome: patient  tolerated procedure well with no complications   Post-procedure details: wound care instructions given   Additional details:  Mupirocin ointment and Bandaid applied    Biopsy proven.  Seborrheic keratosis Left Neck  Reassured benign age-related growth.  Recommend observation.  Discussed cryotherapy if spot(s) become irritated or inflamed.   Return as scheduled.   IJamesetta Orleans, CMA, am acting as scribe for Brendolyn Patty, MD .  Documentation: I have reviewed the above documentation for accuracy and completeness, and I agree with the above.  Brendolyn Patty MD

## 2020-04-25 NOTE — Patient Instructions (Signed)

## 2020-05-02 DIAGNOSIS — E785 Hyperlipidemia, unspecified: Secondary | ICD-10-CM | POA: Diagnosis not present

## 2020-05-02 DIAGNOSIS — M810 Age-related osteoporosis without current pathological fracture: Secondary | ICD-10-CM | POA: Diagnosis not present

## 2020-05-02 DIAGNOSIS — E1159 Type 2 diabetes mellitus with other circulatory complications: Secondary | ICD-10-CM | POA: Diagnosis not present

## 2020-05-02 DIAGNOSIS — E1069 Type 1 diabetes mellitus with other specified complication: Secondary | ICD-10-CM | POA: Diagnosis not present

## 2020-05-02 DIAGNOSIS — I152 Hypertension secondary to endocrine disorders: Secondary | ICD-10-CM | POA: Diagnosis not present

## 2020-06-26 ENCOUNTER — Other Ambulatory Visit: Payer: Self-pay

## 2020-06-26 ENCOUNTER — Ambulatory Visit: Payer: HMO | Admitting: Dermatology

## 2020-06-26 DIAGNOSIS — L814 Other melanin hyperpigmentation: Secondary | ICD-10-CM | POA: Diagnosis not present

## 2020-06-26 DIAGNOSIS — L720 Epidermal cyst: Secondary | ICD-10-CM | POA: Diagnosis not present

## 2020-06-26 DIAGNOSIS — L719 Rosacea, unspecified: Secondary | ICD-10-CM | POA: Diagnosis not present

## 2020-06-26 DIAGNOSIS — L820 Inflamed seborrheic keratosis: Secondary | ICD-10-CM

## 2020-06-26 DIAGNOSIS — L821 Other seborrheic keratosis: Secondary | ICD-10-CM | POA: Diagnosis not present

## 2020-06-26 DIAGNOSIS — L570 Actinic keratosis: Secondary | ICD-10-CM | POA: Diagnosis not present

## 2020-06-26 DIAGNOSIS — Z85828 Personal history of other malignant neoplasm of skin: Secondary | ICD-10-CM | POA: Diagnosis not present

## 2020-06-26 NOTE — Patient Instructions (Signed)
Cryotherapy Aftercare  . Wash gently with soap and water everyday.   Marland Kitchen Apply Vaseline and Band-Aid daily until healed.  Actinic keratoses are precancerous spots that appear secondary to cumulative UV radiation exposure/sun exposure over time. They are chronic with expected duration over 1 year. A portion of actinic keratoses will progress to squamous cell carcinoma of the skin. It is not possible to reliably predict which spots will progress to skin cancer and so treatment is recommended to prevent development of skin cancer.  Recommend daily broad spectrum sunscreen SPF 30+ to sun-exposed areas, reapply every 2 hours as needed.  Recommend staying in the shade or wearing long sleeves, sun glasses (UVA+UVB protection) and wide brim hats (4-inch brim around the entire circumference of the hat). Call for new or changing lesions.

## 2020-06-26 NOTE — Progress Notes (Signed)
Follow-Up Visit   Subjective  Denise Sherman is a 80 y.o. female who presents for the following: Follow-up (Patient here today for 3 month follow up on bcc on left upper lip. Patient states she has some new spots on nose and left cheek she would like checked today. Patient also has a sk on left neck she says she would like removed. ).  It is itchy.  Spot on nose was frozen in past.    The following portions of the chart were reviewed this encounter and updated as appropriate:      Objective  Well appearing patient in no apparent distress; mood and affect are within normal limits.  A focused examination was performed including face, arms. Relevant physical exam findings are noted in the Assessment and Plan.  Objective  left neck x 1: Erythematous keratotic or waxy stuck-on papule   Objective  Right Malar Cheek x 1, left malar cheek x 1: Smooth white papule(s).   Objective  right upper nasal dorsum x 1, right medial cheek x 1, left temple x 1 (3): Erythematous thin papules/macules with gritty scale.   Objective  bilateral cheeks: erythema with telangiectasias on cheeks    Assessment & Plan    Lentigines - Scattered tan macules - Due to sun exposure - Benign-appering, observe - Recommend daily broad spectrum sunscreen SPF 30+ to sun-exposed areas, reapply every 2 hours as needed. - Call for any changes  Seborrheic Keratoses - Stuck-on, waxy, tan-brown papules and/or plaques at face  - Benign-appearing - Discussed benign etiology and prognosis. - Observe - Call for any changes  Inflamed seborrheic keratosis left neck x 1  Prior to procedure, discussed risks of blister formation, small wound, skin dyspigmentation, or rare scar following cryotherapy.    Destruction of lesion - left neck x 1  Destruction method: cryotherapy   Informed consent: discussed and consent obtained   Lesion destroyed using liquid nitrogen: Yes   Region frozen until ice ball extended  beyond lesion: Yes   Outcome: patient tolerated procedure well with no complications   Post-procedure details: wound care instructions given    Milia Right Malar Cheek x 1, left malar cheek x 1  Benign-appearing.  Observation.  Call clinic for new or changing lesions.  Recommend daily use of broad spectrum spf 30+ sunscreen to sun-exposed areas.   Discussed removal/extraction - patient deferred  Actinic keratosis (3) right upper nasal dorsum x 1, right medial cheek x 1, left temple x 1  (right upper nasal dorsum, Ak vs Isk- second treatment today)   Prior to procedure, discussed risks of blister formation, small wound, skin dyspigmentation, or rare scar following cryotherapy.    Destruction of lesion - right upper nasal dorsum x 1, right medial cheek x 1, left temple x 1  Destruction method: cryotherapy   Informed consent: discussed and consent obtained   Lesion destroyed using liquid nitrogen: Yes   Region frozen until ice ball extended beyond lesion: Yes   Outcome: patient tolerated procedure well with no complications   Post-procedure details: wound care instructions given    Rosacea bilateral cheeks  Chronic and controlled  Cont  Permethrin cr qhs ([employer] not covered by insurance)   Rosacea is a chronic progressive skin condition usually affecting the face of adults, causing redness and/or acne bumps. It is treatable but not curable. It sometimes affects the eyes (ocular rosacea) as well. It may respond to topical and/or systemic medication and can flare with stress, sun exposure,  alcohol, exercise and some foods.  Daily application of broad spectrum spf 30+ sunscreen to face is recommended to reduce flares  Recommend Elta MD moisturizer with spf- samples given in office    History of Basal Cell Carcinoma of the Skin - No evidence of recurrence today on left upper lip - Recommend regular full body skin exams - Recommend daily broad spectrum sunscreen SPF 30+ to  sun-exposed areas, reapply every 2 hours as needed.  - Call if any new or changing lesions are noted between office visits   Return in about 6 months (around 12/27/2020) for tbse and ak follow up.  I, Ruthell Rummage, CMA, am acting as scribe for Brendolyn Patty, MD.  Documentation: I have reviewed the above documentation for accuracy and completeness, and I agree with the above.  Brendolyn Patty MD

## 2020-07-05 DIAGNOSIS — Z79899 Other long term (current) drug therapy: Secondary | ICD-10-CM | POA: Diagnosis not present

## 2020-07-05 DIAGNOSIS — E785 Hyperlipidemia, unspecified: Secondary | ICD-10-CM | POA: Diagnosis not present

## 2020-07-05 DIAGNOSIS — E1069 Type 1 diabetes mellitus with other specified complication: Secondary | ICD-10-CM | POA: Diagnosis not present

## 2020-07-10 DIAGNOSIS — I10 Essential (primary) hypertension: Secondary | ICD-10-CM | POA: Diagnosis not present

## 2020-07-10 DIAGNOSIS — Z79899 Other long term (current) drug therapy: Secondary | ICD-10-CM | POA: Diagnosis not present

## 2020-07-10 DIAGNOSIS — M810 Age-related osteoporosis without current pathological fracture: Secondary | ICD-10-CM | POA: Diagnosis not present

## 2020-07-10 DIAGNOSIS — E1069 Type 1 diabetes mellitus with other specified complication: Secondary | ICD-10-CM | POA: Diagnosis not present

## 2020-07-10 DIAGNOSIS — E785 Hyperlipidemia, unspecified: Secondary | ICD-10-CM | POA: Diagnosis not present

## 2020-07-24 DIAGNOSIS — B351 Tinea unguium: Secondary | ICD-10-CM | POA: Diagnosis not present

## 2020-07-24 DIAGNOSIS — M79674 Pain in right toe(s): Secondary | ICD-10-CM | POA: Diagnosis not present

## 2020-07-24 DIAGNOSIS — M2012 Hallux valgus (acquired), left foot: Secondary | ICD-10-CM | POA: Diagnosis not present

## 2020-07-24 DIAGNOSIS — M2011 Hallux valgus (acquired), right foot: Secondary | ICD-10-CM | POA: Diagnosis not present

## 2020-07-24 DIAGNOSIS — M79675 Pain in left toe(s): Secondary | ICD-10-CM | POA: Diagnosis not present

## 2020-07-24 DIAGNOSIS — M2041 Other hammer toe(s) (acquired), right foot: Secondary | ICD-10-CM | POA: Diagnosis not present

## 2020-07-24 DIAGNOSIS — E1142 Type 2 diabetes mellitus with diabetic polyneuropathy: Secondary | ICD-10-CM | POA: Diagnosis not present

## 2020-09-03 DIAGNOSIS — M810 Age-related osteoporosis without current pathological fracture: Secondary | ICD-10-CM | POA: Diagnosis not present

## 2020-09-06 DIAGNOSIS — E109 Type 1 diabetes mellitus without complications: Secondary | ICD-10-CM | POA: Diagnosis not present

## 2020-09-06 DIAGNOSIS — M810 Age-related osteoporosis without current pathological fracture: Secondary | ICD-10-CM | POA: Diagnosis not present

## 2020-11-06 DIAGNOSIS — E119 Type 2 diabetes mellitus without complications: Secondary | ICD-10-CM | POA: Diagnosis not present

## 2021-01-01 ENCOUNTER — Ambulatory Visit: Payer: HMO | Admitting: Dermatology

## 2021-01-01 ENCOUNTER — Other Ambulatory Visit: Payer: Self-pay

## 2021-01-01 DIAGNOSIS — Z1283 Encounter for screening for malignant neoplasm of skin: Secondary | ICD-10-CM | POA: Diagnosis not present

## 2021-01-01 DIAGNOSIS — L814 Other melanin hyperpigmentation: Secondary | ICD-10-CM

## 2021-01-01 DIAGNOSIS — L719 Rosacea, unspecified: Secondary | ICD-10-CM

## 2021-01-01 DIAGNOSIS — D229 Melanocytic nevi, unspecified: Secondary | ICD-10-CM

## 2021-01-01 DIAGNOSIS — L578 Other skin changes due to chronic exposure to nonionizing radiation: Secondary | ICD-10-CM

## 2021-01-01 DIAGNOSIS — D692 Other nonthrombocytopenic purpura: Secondary | ICD-10-CM | POA: Diagnosis not present

## 2021-01-01 DIAGNOSIS — L821 Other seborrheic keratosis: Secondary | ICD-10-CM | POA: Diagnosis not present

## 2021-01-01 DIAGNOSIS — D1800 Hemangioma unspecified site: Secondary | ICD-10-CM | POA: Diagnosis not present

## 2021-01-01 DIAGNOSIS — Z85828 Personal history of other malignant neoplasm of skin: Secondary | ICD-10-CM

## 2021-01-01 DIAGNOSIS — L80 Vitiligo: Secondary | ICD-10-CM

## 2021-01-01 MED ORDER — PERMETHRIN 5 % EX CREA: 1.0000 "application " | TOPICAL_CREAM | Freq: Every day | CUTANEOUS | 1 refills | Status: AC

## 2021-01-01 MED ORDER — METRONIDAZOLE 0.75 % EX CREA: TOPICAL_CREAM | CUTANEOUS | 1 refills | Status: DC

## 2021-01-01 NOTE — Patient Instructions (Addendum)
Cerave SA Cream for rough and bumpy skin at face, cream recommend applying at night and  moisturizer with spf for day time.     Topical steroids (such as triamcinolone, fluocinolone, fluocinonide, mometasone, clobetasol, halobetasol, betamethasone, hydrocortisone) can cause thinning and lightening of the skin if they are used for too long in the same area. Your physician has selected the right strength medicine for your problem and area affected on the body. Please use your medication only as directed by your physician to prevent side effects.      Rosacea  What is rosacea? Rosacea (say: ro-zay-sha) is a common skin disease that usually begins as a trend of flushing or blushing easily.  As rosacea progresses, a persistent redness in the center of the face will develop and may gradually spread beyond the nose and cheeks to the forehead and chin.  In some cases, the ears, chest, and back could be affected.  Rosacea may appear as tiny blood vessels or small red bumps that occur in crops.  Frequently they can contain pus, and are called "pustules".  If the bumps do not contain pus, they are referred to as "papules".  Rarely, in prolonged, untreated cases of rosacea, the oil glands of the nose and cheeks may become permanently enlarged.  This is called rhinophyma, and is seen more frequently in men.  Signs and Risks In its beginning stages, rosacea tends to come and go, which makes it difficult to recognize.  It can start as intermittent flushing of the face.  Eventually, blood vessels may become permanently visible.  Pustules and papules can appear, but can be mistaken for adult acne.  People of all races, ages, genders and ethnic groups are at risk of developing rosacea.  However, it is more common in women (especially around menopause) and adults with fair skin between the ages of 2 and 59.  Treatment Dermatologists typically recommend a combination of treatments to effectively manage rosacea.   Treatment can improve symptoms and may stop the progression of the rosacea.  Treatment may involve both topical and oral medications.  The tetracycline antibiotics are often used for their anti-inflammatory effect; however, because of the possibility of developing antibiotic resistance, they should not be used long term at full dose.  For dilated blood vessels the options include electrodessication (uses electric current through a small needle), laser treatment, and cosmetics to hide the redness.   With all forms of treatment, improvement is a slow process, and patients may not see any results for the first 3-4 weeks.  It is very important to avoid the sun and other triggers.  Patients must wear sunscreen daily.  Skin Care Instructions: Cleanse the skin with a mild soap such as CeraVe cleanser, Cetaphil cleanser, or Dove soap once or twice daily as needed. Moisturize with Eucerin Redness Relief Daily Perfecting Lotion (has a subtle green tint), CeraVe Moisturizing Cream, or Oil of Olay Daily Moisturizer with sunscreen every morning and/or night as recommended. Makeup should be "non-comedogenic" (won't clog pores) and be labeled "for sensitive skin". Good choices for cosmetics are: Neutrogena, Almay, and Physician's Formula.  Any product with a green tint tends to offset a red complexion. If your eyes are dry and irritated, use artificial tears 2-3 times per day and cleanse the eyelids daily with baby shampoo.  Have your eyes examined at least every 2 years.  Be sure to tell your eye doctor that you have rosacea. Alcoholic beverages tend to cause flushing of the skin, and may  make rosacea worse. Always wear sunscreen, protect your skin from extreme hot and cold temperatures, and avoid spicy foods, hot drinks, and mechanical irritation such as rubbing, scrubbing, or massaging the face.  Avoid harsh skin cleansers, cleansing masks, astringents, and exfoliation. If a particular product burns or makes your face  feel tight, then it is likely to flare your rosacea. If you are having difficulty finding a sunscreen that you can tolerate, you may try switching to a chemical-free sunscreen.  These are ones whose active ingredient is zinc oxide or titanium dioxide only.  They should also be fragrance free, non-comedogenic, and labeled for sensitive skin. Rosacea triggers may vary from person to person.  There are a variety of foods that have been reported to trigger rosacea.  Some patients find that keeping a diary of what they were doing when they flared helps them avoid triggers.            Melanoma ABCDEs  Melanoma is the most dangerous type of skin cancer, and is the leading cause of death from skin disease.  You are more likely to develop melanoma if you: Have light-colored skin, light-colored eyes, or red or blond hair Spend a lot of time in the sun Tan regularly, either outdoors or in a tanning bed Have had blistering sunburns, especially during childhood Have a close family member who has had a melanoma Have atypical moles or large birthmarks  Early detection of melanoma is key since treatment is typically straightforward and cure rates are extremely high if we catch it early.   The first sign of melanoma is often a change in a mole or a new dark spot.  The ABCDE system is a way of remembering the signs of melanoma.  A for asymmetry:  The two halves do not match. B for border:  The edges of the growth are irregular. C for color:  A mixture of colors are present instead of an even brown color. D for diameter:  Melanomas are usually (but not always) greater than 80mm - the size of a pencil eraser. E for evolution:  The spot keeps changing in size, shape, and color.  Please check your skin once per month between visits. You can use a small mirror in front and a large mirror behind you to keep an eye on the back side or your body.   If you see any new or changing lesions before your next  follow-up, please call to schedule a visit.  Please continue daily skin protection including broad spectrum sunscreen SPF 30+ to sun-exposed areas, reapplying every 2 hours as needed when you're outdoors.    If you have any questions or concerns for your doctor, please call our main line at [phone] and press option 4 to reach your doctor's medical assistant. If no one answers, please leave a voicemail as directed and we will return your call as soon as possible. Messages left after 4 pm will be answered the following business day.   You may also send [place] a message via [place]. We typically respond to MyChart messages within 1-2 business days.  For prescription refills, please ask your pharmacy to contact our office. Our fax number is [phone].  If you have an urgent issue when the clinic is closed that cannot wait until the next business day, you can page your doctor at the number below.    Please note that while we do our best to be available for urgent issues outside of office hours,  we are not available 24/7.   If you have an urgent issue and are unable to reach [place], you may choose to seek medical care at your doctor's office, retail clinic, urgent care center, or emergency room.  If you have a medical emergency, please immediately call 911 or go to the emergency department.  Pager Numbers  - Dr. Nehemiah Massed: [phone]  - Dr. Laurence Ferrari: [phone]  - Dr. Nicole Kindred: [phone]  In the event of inclement weather, please call our main line at [phone] for an update on the status of any delays or closures.  Dermatology Medication Tips: Please keep the boxes that topical medications come in in order to help keep track of the instructions about where and how to use these. Pharmacies typically print the medication instructions only on the boxes and not directly on the medication tubes.   If your medication is too expensive, please contact our office at [phone] option 4 or send  [place] a message through [place].   We are unable to tell what your co-pay for medications will be in advance as this is different depending on your insurance coverage. However, we may be able to find a substitute medication at lower cost or fill out paperwork to get insurance to cover a needed medication.   If a prior authorization is required to get your medication covered by your insurance company, please allow [place] 1-2 business days to complete this process.  Drug prices often vary depending on where the prescription is filled and some pharmacies may offer cheaper prices.  The website www.goodrx.com contains coupons for medications through different pharmacies. The prices here do not account for what the cost may be with help from insurance (it may be cheaper with your insurance), but the website can give you the price if you did not use any insurance.  - You can print the associated coupon and take it with your prescription to the pharmacy.  - You may also stop by our office during regular business hours and pick up a GoodRx coupon card.  - If you need your prescription sent electronically to a different pharmacy, notify our office through [facility] or by phone at [phone] option 4.

## 2021-01-01 NOTE — Progress Notes (Signed)
Follow-Up Visit   Subjective  Denise Sherman is a 80 y.o. female who presents for the following: Follow-up (6 month follow up tbse. Patient reports she would like refill of medication for rosacea. ).  Patient here for full body skin exam and skin cancer screening.   The following portions of the chart were reviewed this encounter and updated as appropriate:      Review of Systems: No other skin or systemic complaints except as noted in HPI or Assessment and Plan.   Objective  Well appearing patient in no apparent distress; mood and affect are within normal limits.  A full examination was performed including scalp, head, eyes, ears, nose, lips, neck, chest, axillae, abdomen, back, buttocks, bilateral upper extremities, bilateral lower extremities, hands, feet, fingers, toes, fingernails, and toenails. All findings within normal limits unless otherwise noted below.  bilateral cheeks With erythema and telangectasia  left > right cheek  chest, back, arms, perioccular, neck Hypopigmented patches and macules at chest, back,neck, arms, and periocular   Assessment & Plan  Rosacea bilateral cheeks  Rosacea is a chronic progressive skin condition usually affecting the face of adults, causing redness and/or acne bumps. It is treatable but not curable. It sometimes affects the eyes (ocular rosacea) as well. It may respond to topical and/or systemic medication and can flare with stress, sun exposure, alcohol, exercise and some foods.  Daily application of broad spectrum spf 30+ sunscreen to face is recommended to reduce flares.   Chronic and controlled   Cont  Permethrin cr qhs (Soolantra not covered by insurance) Rfs sent to [facility]   Metronidazole cream use topically 1 - 2 times daily for face as needed for flares  Refills sent to [facility]    metroNIDAZOLE (METROCREAM) 0.75 % cream - bilateral cheeks Apply topically See admin instructions. Apply 1 - 2 times daily as needed to face  for rosacea  permethrin (ELIMITE) 5 % cream - bilateral cheeks Apply 1 application topically at bedtime. For face for rosacea  Vitiligo chest, back, arms, perioccular, neck  Reviewed chronic nature, no cure and can be difficult to treat.  Vitiligo is an autoimmune condition which causes loss of skin pigment and is commonly seen on the face and may also involve areas of trauma like hands, elbows, knees, and ankles.  Treatments include topical steroids and other topical anti-inflammatory ointments/creams and topical and oral Jak inhibitors.  Sometimes narrow band UV light therapy or Xtrac laser is helpful, both of which require twice weekly treatments for at least 3-6 months.  Antioxidant vitamins, such as Vitamins A,C,E,D, Folic Acid and B12 may be added to enhance treatment.  Patient defers treatment   Lentigines - Scattered tan macules - Due to sun exposure - Benign-appearing, observe - Recommend daily broad spectrum sunscreen SPF 30+ to sun-exposed areas, reapply every 2 hours as needed. - Call for any changes  Seborrheic Keratoses - Stuck-on, waxy, tan-brown papules and/or plaques  - Benign-appearing - Discussed benign etiology and prognosis. - Observe - Call for any changes  Melanocytic Nevi - Tan-brown and/or pink-flesh-colored symmetric macules and papules - Benign appearing on exam today - Observation - Call clinic for new or changing moles - Recommend daily use of broad spectrum spf 30+ sunscreen to sun-exposed areas.   Hemangiomas - Red papules - Discussed benign nature - Observe - Call for any changes  Actinic Damage - Chronic condition, secondary to cumulative UV/sun exposure - diffuse scaly erythematous macules with underlying dyspigmentation - Recommend daily broad  spectrum sunscreen SPF 30+ to sun-exposed areas, reapply every 2 hours as needed.  - Staying in the shade or wearing long sleeves, sun glasses (UVA+UVB protection) and wide brim hats (4-inch brim  around the entire circumference of the hat) are also recommended for sun protection.  - Call for new or changing lesions.  Purpura - Chronic; persistent and recurrent.  Treatable, but not curable. - Violaceous macules and patches - Benign - Related to trauma, age, sun damage and/or use of blood thinners, chronic use of topical and/or oral steroids - Observe - Can use OTC arnica containing moisturizer such as Dermend Bruise Formula if desired - Call for worsening or other concerns   History of Basal Cell Carcinoma of the Skin - No evidence of recurrence today on left upper lip 2022, left superior forehead 2020, forehead - Recommend regular full body skin exams - Recommend daily broad spectrum sunscreen SPF 30+ to sun-exposed areas, reapply every 2 hours as needed.  - Call if any new or changing lesions are noted between office visits  Skin cancer screening performed today.  Return in about 1 year (around 01/01/2022) for tbse .  I, Ruthell Rummage, CMA, am acting as scribe for Brendolyn Patty, MD.  Documentation: I have reviewed the above documentation for accuracy and completeness, and I agree with the above.  Brendolyn Patty MD

## 2021-01-08 DIAGNOSIS — E785 Hyperlipidemia, unspecified: Secondary | ICD-10-CM | POA: Diagnosis not present

## 2021-01-08 DIAGNOSIS — E1159 Type 2 diabetes mellitus with other circulatory complications: Secondary | ICD-10-CM | POA: Diagnosis not present

## 2021-01-08 DIAGNOSIS — E559 Vitamin D deficiency, unspecified: Secondary | ICD-10-CM | POA: Diagnosis not present

## 2021-01-08 DIAGNOSIS — M810 Age-related osteoporosis without current pathological fracture: Secondary | ICD-10-CM | POA: Diagnosis not present

## 2021-01-08 DIAGNOSIS — E109 Type 1 diabetes mellitus without complications: Secondary | ICD-10-CM | POA: Diagnosis not present

## 2021-01-08 DIAGNOSIS — I152 Hypertension secondary to endocrine disorders: Secondary | ICD-10-CM | POA: Diagnosis not present

## 2021-01-08 DIAGNOSIS — E1069 Type 1 diabetes mellitus with other specified complication: Secondary | ICD-10-CM | POA: Diagnosis not present

## 2021-04-01 DIAGNOSIS — E1069 Type 1 diabetes mellitus with other specified complication: Secondary | ICD-10-CM | POA: Diagnosis not present

## 2021-04-01 DIAGNOSIS — Z79899 Other long term (current) drug therapy: Secondary | ICD-10-CM | POA: Diagnosis not present

## 2021-04-01 DIAGNOSIS — E785 Hyperlipidemia, unspecified: Secondary | ICD-10-CM | POA: Diagnosis not present

## 2021-04-05 DIAGNOSIS — Z0000 Encounter for general adult medical examination without abnormal findings: Secondary | ICD-10-CM | POA: Diagnosis not present

## 2021-04-05 DIAGNOSIS — Z79899 Other long term (current) drug therapy: Secondary | ICD-10-CM | POA: Diagnosis not present

## 2021-04-05 DIAGNOSIS — Z1331 Encounter for screening for depression: Secondary | ICD-10-CM | POA: Diagnosis not present

## 2021-04-15 DIAGNOSIS — M5136 Other intervertebral disc degeneration, lumbar region: Secondary | ICD-10-CM | POA: Diagnosis not present

## 2021-04-15 DIAGNOSIS — M5416 Radiculopathy, lumbar region: Secondary | ICD-10-CM | POA: Diagnosis not present

## 2021-04-15 DIAGNOSIS — M48062 Spinal stenosis, lumbar region with neurogenic claudication: Secondary | ICD-10-CM | POA: Diagnosis not present

## 2021-04-18 DIAGNOSIS — M5442 Lumbago with sciatica, left side: Secondary | ICD-10-CM | POA: Diagnosis not present

## 2021-04-26 DIAGNOSIS — M5442 Lumbago with sciatica, left side: Secondary | ICD-10-CM | POA: Diagnosis not present

## 2021-04-29 DIAGNOSIS — E1142 Type 2 diabetes mellitus with diabetic polyneuropathy: Secondary | ICD-10-CM | POA: Diagnosis not present

## 2021-04-29 DIAGNOSIS — S92354A Nondisplaced fracture of fifth metatarsal bone, right foot, initial encounter for closed fracture: Secondary | ICD-10-CM | POA: Diagnosis not present

## 2021-04-29 DIAGNOSIS — M79671 Pain in right foot: Secondary | ICD-10-CM | POA: Diagnosis not present

## 2021-05-03 DIAGNOSIS — M5442 Lumbago with sciatica, left side: Secondary | ICD-10-CM | POA: Diagnosis not present

## 2021-05-10 DIAGNOSIS — M5442 Lumbago with sciatica, left side: Secondary | ICD-10-CM | POA: Diagnosis not present

## 2021-05-20 ENCOUNTER — Ambulatory Visit: Payer: HMO | Admitting: Dermatology

## 2021-06-13 DIAGNOSIS — S92354A Nondisplaced fracture of fifth metatarsal bone, right foot, initial encounter for closed fracture: Secondary | ICD-10-CM | POA: Diagnosis not present

## 2021-06-20 ENCOUNTER — Other Ambulatory Visit: Payer: Self-pay | Admitting: Physical Medicine and Rehabilitation

## 2021-06-20 DIAGNOSIS — M5136 Other intervertebral disc degeneration, lumbar region: Secondary | ICD-10-CM | POA: Diagnosis not present

## 2021-06-20 DIAGNOSIS — M5416 Radiculopathy, lumbar region: Secondary | ICD-10-CM

## 2021-06-20 DIAGNOSIS — M48062 Spinal stenosis, lumbar region with neurogenic claudication: Secondary | ICD-10-CM | POA: Diagnosis not present

## 2021-06-21 ENCOUNTER — Ambulatory Visit
Admission: RE | Admit: 2021-06-21 | Discharge: 2021-06-21 | Disposition: A | Discharge status: Home / Self Care | Payer: HMO | Source: Ambulatory Visit | Attending: Physical Medicine and Rehabilitation | Admitting: Physical Medicine and Rehabilitation

## 2021-06-21 DIAGNOSIS — M5416 Radiculopathy, lumbar region: Secondary | ICD-10-CM | POA: Insufficient documentation

## 2021-06-21 DIAGNOSIS — M4316 Spondylolisthesis, lumbar region: Secondary | ICD-10-CM | POA: Diagnosis not present

## 2021-06-21 DIAGNOSIS — M545 Low back pain, unspecified: Secondary | ICD-10-CM | POA: Diagnosis not present

## 2021-06-26 ENCOUNTER — Ambulatory Visit
Admission: RE | Admit: 2021-06-26 | Discharge: 2021-06-26 | Disposition: A | Discharge status: Home / Self Care | Payer: HMO | Source: Ambulatory Visit | Attending: Physical Medicine and Rehabilitation | Admitting: Physical Medicine and Rehabilitation

## 2021-06-26 ENCOUNTER — Other Ambulatory Visit: Payer: Self-pay | Admitting: Physical Medicine and Rehabilitation

## 2021-06-26 DIAGNOSIS — M5414 Radiculopathy, thoracic region: Secondary | ICD-10-CM | POA: Insufficient documentation

## 2021-06-26 DIAGNOSIS — M549 Dorsalgia, unspecified: Secondary | ICD-10-CM | POA: Diagnosis not present

## 2021-06-26 DIAGNOSIS — M48062 Spinal stenosis, lumbar region with neurogenic claudication: Secondary | ICD-10-CM | POA: Diagnosis not present

## 2021-06-26 DIAGNOSIS — M5136 Other intervertebral disc degeneration, lumbar region: Secondary | ICD-10-CM | POA: Diagnosis not present

## 2021-06-26 DIAGNOSIS — M5416 Radiculopathy, lumbar region: Secondary | ICD-10-CM | POA: Diagnosis not present

## 2021-07-01 DIAGNOSIS — Z681 Body mass index (BMI) 19 or less, adult: Secondary | ICD-10-CM | POA: Diagnosis not present

## 2021-07-01 DIAGNOSIS — S22080D Wedge compression fracture of T11-T12 vertebra, subsequent encounter for fracture with routine healing: Secondary | ICD-10-CM | POA: Diagnosis not present

## 2021-07-01 DIAGNOSIS — G8929 Other chronic pain: Secondary | ICD-10-CM | POA: Diagnosis not present

## 2021-07-01 DIAGNOSIS — M545 Low back pain, unspecified: Secondary | ICD-10-CM | POA: Diagnosis not present

## 2021-07-05 DIAGNOSIS — M48062 Spinal stenosis, lumbar region with neurogenic claudication: Secondary | ICD-10-CM | POA: Diagnosis not present

## 2021-07-05 DIAGNOSIS — M5136 Other intervertebral disc degeneration, lumbar region: Secondary | ICD-10-CM | POA: Diagnosis not present

## 2021-07-11 DIAGNOSIS — E559 Vitamin D deficiency, unspecified: Secondary | ICD-10-CM | POA: Diagnosis not present

## 2021-07-11 DIAGNOSIS — E109 Type 1 diabetes mellitus without complications: Secondary | ICD-10-CM | POA: Diagnosis not present

## 2021-07-15 DIAGNOSIS — M810 Age-related osteoporosis without current pathological fracture: Secondary | ICD-10-CM | POA: Diagnosis not present

## 2021-07-24 DIAGNOSIS — M546 Pain in thoracic spine: Secondary | ICD-10-CM | POA: Diagnosis not present

## 2021-07-25 DIAGNOSIS — S22080G Wedge compression fracture of T11-T12 vertebra, subsequent encounter for fracture with delayed healing: Secondary | ICD-10-CM | POA: Diagnosis not present

## 2021-07-25 DIAGNOSIS — Z681 Body mass index (BMI) 19 or less, adult: Secondary | ICD-10-CM | POA: Diagnosis not present

## 2021-07-25 DIAGNOSIS — S22070G Wedge compression fracture of T9-T10 vertebra, subsequent encounter for fracture with delayed healing: Secondary | ICD-10-CM | POA: Diagnosis not present

## 2021-07-26 ENCOUNTER — Other Ambulatory Visit: Payer: Self-pay | Admitting: Neurosurgery

## 2021-07-29 ENCOUNTER — Other Ambulatory Visit: Payer: Self-pay

## 2021-07-29 ENCOUNTER — Encounter (HOSPITAL_COMMUNITY): Payer: Self-pay | Admitting: Neurosurgery

## 2021-07-29 NOTE — Pre-Procedure Instructions (Signed)
[id number] - [name], [place] - [address] [place] [address] Phone: [phone] Fax: [phone]   PCP - Derinda Late, MD  EKG - DOS  Fasting Blood Sugar - 150 Checks Blood Sugar 6/day Glucose Monitor:  ERAS Protcol - Clears until   COVID TEST- N/A  Anesthesia review: N  Patient verbally denies any shortness of breath, fever, cough and chest pain during phone call   -------------  SDW INSTRUCTIONS given:  Your procedure is scheduled on 07/31/21.  Report to [facility] Main Entrance "A" at 0700 A.M., and check in at the Admitting office.  Call this number if you have problems the morning of surgery:  [phone]   Remember:  Do not eat after midnight the night before your surgery  You may drink clear liquids until 0630 the morning of your surgery.   Clear liquids allowed are: Water, Non-Citrus Juices (without pulp), Carbonated Beverages, Clear Tea, Black Coffee Only, and Gatorade    Take these medicines the morning of surgery with A SIP OF WATER  amLODipine (NORVASC)  HYDROcodone-acetaminophen (NORCO/VICODIN)-of needed tiZANidine (ZANAFLEX)-if needed traMADol (ULTRAM)-if needed  Do not take bedtime dose of insulin aspart on 07/30/21. Only take 1/2 of normal dose on the morning of surgery ONLY if the blood sugar is above 220.     .** PLEASE check your blood sugar the morning of your surgery when you wake up and every 2 hours until you get to the Short Stay unit.  If your blood sugar is less than 70 mg/dL, you will need to treat for low blood sugar: Do not take insulin. Treat a low blood sugar (less than 70 mg/dL) with  cup of clear juice (cranberry or apple), 4 glucose tablets, OR glucose gel. Recheck blood sugar in 15 minutes after treatment (to make sure it is greater than 70 mg/dL). If your blood sugar is not greater than 70 mg/dL on recheck, call [phone] for  further instructions.  As of today, STOP taking any Aspirin (unless otherwise instructed by your surgeon) Aleve, Naproxen, Ibuprofen, Motrin, Advil, Goody's, BC's, all herbal medications, fish oil, and all vitamins.                      Do not wear jewelry, make up, or nail polish            Do not wear lotions, powders, perfumes/colognes, or deodorant.            Do not shave 48 hours prior to surgery.  Men may shave face and neck.            Do not bring valuables to the hospital.            [facility] is not responsible for any belongings or valuables.  Do NOT Smoke (Tobacco/Vaping) 24 hours prior to your procedure If you use a CPAP at night, you may bring all equipment for your overnight stay.   Contacts, glasses, dentures or bridgework may not be worn into surgery.      For patients admitted to the hospital, discharge time will be determined by your treatment team.   Patients discharged the day of surgery will not be allowed to drive home, and someone needs to stay with them for 24 hours.    Special instructions:   [facility]- Preparing For Surgery  Before surgery, you can play an important role. Because skin  is not sterile, your skin needs to be as free of germs as possible. You can reduce the number of germs on your skin by washing with CHG (chlorahexidine gluconate) Soap before surgery.  CHG is an antiseptic cleaner which kills germs and bonds with the skin to continue killing germs even after washing.    Oral Hygiene is also important to reduce your risk of infection.  Remember - BRUSH YOUR TEETH THE MORNING OF SURGERY WITH YOUR REGULAR TOOTHPASTE  Please do not use if you have an allergy to CHG or antibacterial soaps. If your skin becomes reddened/irritated stop using the CHG.  Do not shave (including legs and underarms) for at least 48 hours prior to first CHG shower. It is OK to shave your face.  Please follow these instructions carefully.   Shower the NIGHT BEFORE  SURGERY and the MORNING OF SURGERY with DIAL Soap.   Pat yourself dry with a CLEAN TOWEL.  Wear CLEAN PAJAMAS to bed the night before surgery  Place CLEAN SHEETS on your bed the night of your first shower and DO NOT SLEEP WITH PETS.   Day of Surgery: Please shower morning of surgery  Wear Clean/Comfortable clothing the morning of surgery Do not apply any deodorants/lotions.   Remember to brush your teeth WITH YOUR REGULAR TOOTHPASTE.   Questions were answered. Patient verbalized understanding of instructions.

## 2021-07-30 ENCOUNTER — Other Ambulatory Visit: Payer: Self-pay | Admitting: Neurosurgery

## 2021-07-30 NOTE — Progress Notes (Signed)
patient voiced understanding of new arrival time of 0630

## 2021-07-31 ENCOUNTER — Ambulatory Visit (HOSPITAL_COMMUNITY): Payer: HMO

## 2021-07-31 ENCOUNTER — Ambulatory Visit (HOSPITAL_BASED_OUTPATIENT_CLINIC_OR_DEPARTMENT_OTHER): Payer: HMO | Admitting: Certified Registered"

## 2021-07-31 ENCOUNTER — Ambulatory Visit (HOSPITAL_COMMUNITY)
Admission: RE | Admit: 2021-07-31 | Discharge: 2021-07-31 | Disposition: A | Discharge status: Home / Self Care | Payer: HMO | Attending: Neurosurgery | Admitting: Neurosurgery

## 2021-07-31 ENCOUNTER — Other Ambulatory Visit: Payer: Self-pay

## 2021-07-31 ENCOUNTER — Ambulatory Visit (HOSPITAL_COMMUNITY): Payer: HMO | Admitting: Certified Registered"

## 2021-07-31 ENCOUNTER — Encounter (HOSPITAL_COMMUNITY)
Admission: RE | Disposition: A | Discharge status: Home / Self Care | Payer: Self-pay | Source: Home / Self Care | Attending: Neurosurgery

## 2021-07-31 DIAGNOSIS — S22079A Unspecified fracture of T9-T10 vertebra, initial encounter for closed fracture: Secondary | ICD-10-CM | POA: Diagnosis not present

## 2021-07-31 DIAGNOSIS — Z87891 Personal history of nicotine dependence: Secondary | ICD-10-CM | POA: Insufficient documentation

## 2021-07-31 DIAGNOSIS — S22080A Wedge compression fracture of T11-T12 vertebra, initial encounter for closed fracture: Secondary | ICD-10-CM | POA: Diagnosis not present

## 2021-07-31 DIAGNOSIS — E119 Type 2 diabetes mellitus without complications: Secondary | ICD-10-CM | POA: Insufficient documentation

## 2021-07-31 DIAGNOSIS — M8008XA Age-related osteoporosis with current pathological fracture, vertebra(e), initial encounter for fracture: Secondary | ICD-10-CM | POA: Diagnosis not present

## 2021-07-31 DIAGNOSIS — I119 Hypertensive heart disease without heart failure: Secondary | ICD-10-CM | POA: Insufficient documentation

## 2021-07-31 DIAGNOSIS — M4854XA Collapsed vertebra, not elsewhere classified, thoracic region, initial encounter for fracture: Secondary | ICD-10-CM | POA: Diagnosis not present

## 2021-07-31 DIAGNOSIS — S22070A Wedge compression fracture of T9-T10 vertebra, initial encounter for closed fracture: Secondary | ICD-10-CM | POA: Diagnosis not present

## 2021-07-31 HISTORY — PX: KYPHOPLASTY: SHX5884

## 2021-07-31 LAB — SURGICAL PCR SCREEN
MRSA, PCR: NEGATIVE
Staphylococcus aureus: NEGATIVE

## 2021-07-31 LAB — BASIC METABOLIC PANEL
Anion gap: 11 (ref 5–15)
BUN: 17 mg/dL (ref 8–23)
CO2: 24 mmol/L (ref 22–32)
Calcium: 8.6 mg/dL — ABNORMAL LOW (ref 8.9–10.3)
Chloride: 97 mmol/L — ABNORMAL LOW (ref 98–111)
Creatinine, Ser: 0.91 mg/dL (ref 0.44–1.00)
GFR, Estimated: >60 mL/min (ref >60)
Glucose, Bld: 178 mg/dL — ABNORMAL HIGH (ref 70–99)
Potassium: 4 mmol/L (ref 3.5–5.1)
Sodium: 132 mmol/L — ABNORMAL LOW (ref 135–145)

## 2021-07-31 LAB — GLUCOSE, CAPILLARY
Glucose-Capillary: 195 mg/dL — ABNORMAL HIGH (ref 70–99)
Glucose-Capillary: 126 mg/dL — ABNORMAL HIGH (ref 70–99)
Glucose-Capillary: 126 mg/dL — ABNORMAL HIGH (ref 70–99)

## 2021-07-31 LAB — CBC
HCT: 42.3 % (ref 36.0–46.0)
Hemoglobin: 14.2 g/dL (ref 12.0–15.0)
MCH: 30.3 pg (ref 26.0–34.0)
MCHC: 33.6 g/dL (ref 30.0–36.0)
MCV: 90.2 fL (ref 80.0–100.0)
Platelets: 318 10*3/uL (ref 150–400)
RBC: 4.69 MIL/uL (ref 3.87–5.11)
RDW: 13.2 % (ref 11.5–15.5)
WBC: 5.7 10*3/uL (ref 4.0–10.5)
nRBC: 0 % (ref 0.0–0.2)

## 2021-07-31 SURGERY — KYPHOPLASTY
Anesthesia: General

## 2021-07-31 MED ORDER — OXYCODONE HCL 5 MG PO TABS
5.0000 mg | ORAL_TABLET | Freq: Once | ORAL | Status: AC | PRN
  Administered 2021-07-31: 5 mg via ORAL

## 2021-07-31 MED ORDER — ACETAMINOPHEN 10 MG/ML IV SOLN: 1000.0000 mg | Freq: Once | INTRAVENOUS | Status: DC | PRN

## 2021-07-31 MED ORDER — BACITRACIN ZINC 500 UNIT/GM EX OINT
TOPICAL_OINTMENT | CUTANEOUS | Status: DC | PRN
  Administered 2021-07-31: 1 via TOPICAL

## 2021-07-31 MED ORDER — OXYCODONE HCL 5 MG PO TABS
ORAL_TABLET | ORAL | Status: AC
  Filled 2021-07-31: qty 1

## 2021-07-31 MED ORDER — PHENYLEPHRINE HCL-NACL 20-0.9 MG/250ML-% IV SOLN
INTRAVENOUS | Status: DC | PRN
  Administered 2021-07-31: 50 ug/min via INTRAVENOUS

## 2021-07-31 MED ORDER — DEXMEDETOMIDINE (PRECEDEX) IN NS 20 MCG/5ML (4 MCG/ML) IV SYRINGE
PREFILLED_SYRINGE | INTRAVENOUS | Status: DC | PRN
  Administered 2021-07-31: 6 ug via INTRAVENOUS

## 2021-07-31 MED ORDER — 0.9 % SODIUM CHLORIDE (POUR BTL) OPTIME
TOPICAL | Status: DC | PRN
  Administered 2021-07-31: 1000 mL

## 2021-07-31 MED ORDER — INSULIN ASPART 100 UNIT/ML IJ SOLN
0.0000 [IU] | INTRAMUSCULAR | Status: DC | PRN
  Administered 2021-07-31: 2 [IU] via SUBCUTANEOUS
  Filled 2021-07-31: qty 1

## 2021-07-31 MED ORDER — CHLORHEXIDINE GLUCONATE 0.12 % MT SOLN
15.0000 mL | Freq: Once | OROMUCOSAL | Status: AC
  Administered 2021-07-31: 15 mL via OROMUCOSAL
  Filled 2021-07-31: qty 15

## 2021-07-31 MED ORDER — KETAMINE HCL 10 MG/ML IJ SOLN
INTRAMUSCULAR | Status: DC | PRN
  Administered 2021-07-31: 20 mg via INTRAVENOUS

## 2021-07-31 MED ORDER — KETAMINE HCL 50 MG/5ML IJ SOSY
PREFILLED_SYRINGE | INTRAMUSCULAR | Status: AC
  Filled 2021-07-31: qty 5

## 2021-07-31 MED ORDER — PROPOFOL 10 MG/ML IV BOLUS
INTRAVENOUS | Status: AC
  Filled 2021-07-31: qty 20

## 2021-07-31 MED ORDER — FENTANYL CITRATE (PF) 250 MCG/5ML IJ SOLN
INTRAMUSCULAR | Status: AC
  Filled 2021-07-31: qty 5

## 2021-07-31 MED ORDER — ORAL CARE MOUTH RINSE: 15.0000 mL | Freq: Once | OROMUCOSAL | Status: AC

## 2021-07-31 MED ORDER — ROCURONIUM BROMIDE 10 MG/ML (PF) SYRINGE
PREFILLED_SYRINGE | INTRAVENOUS | Status: DC | PRN
  Administered 2021-07-31: 80 mg via INTRAVENOUS

## 2021-07-31 MED ORDER — CHLORHEXIDINE GLUCONATE CLOTH 2 % EX PADS: 6.0000 | MEDICATED_PAD | Freq: Once | CUTANEOUS | Status: DC

## 2021-07-31 MED ORDER — BUPIVACAINE-EPINEPHRINE 0.5% -1:200000 IJ SOLN
INTRAMUSCULAR | Status: AC
Start: 2021-07-31 — End: ?
  Filled 2021-07-31: qty 1

## 2021-07-31 MED ORDER — PROPOFOL 10 MG/ML IV BOLUS
INTRAVENOUS | Status: DC | PRN
  Administered 2021-07-31: 100 mg via INTRAVENOUS
  Administered 2021-07-31: 30 mg via INTRAVENOUS

## 2021-07-31 MED ORDER — ONDANSETRON HCL 4 MG/2ML IJ SOLN
4.0000 mg | Freq: Once | INTRAMUSCULAR | Status: DC | PRN
Start: 2021-07-31 — End: 2021-07-31

## 2021-07-31 MED ORDER — SUGAMMADEX SODIUM 200 MG/2ML IV SOLN
INTRAVENOUS | Status: DC | PRN
  Administered 2021-07-31: 200 mg via INTRAVENOUS

## 2021-07-31 MED ORDER — IOPAMIDOL (ISOVUE-300) INJECTION 61%
INTRAVENOUS | Status: DC | PRN
  Administered 2021-07-31: 100 mL

## 2021-07-31 MED ORDER — PHENYLEPHRINE 80 MCG/ML (10ML) SYRINGE FOR IV PUSH (FOR BLOOD PRESSURE SUPPORT)
PREFILLED_SYRINGE | INTRAVENOUS | Status: DC | PRN
  Administered 2021-07-31: 80 ug via INTRAVENOUS
  Administered 2021-07-31: 120 ug via INTRAVENOUS
  Administered 2021-07-31: 40 ug via INTRAVENOUS

## 2021-07-31 MED ORDER — OXYCODONE HCL 5 MG/5ML PO SOLN: 5.0000 mg | Freq: Once | ORAL | Status: AC | PRN

## 2021-07-31 MED ORDER — BUPIVACAINE-EPINEPHRINE 0.5% -1:200000 IJ SOLN
INTRAMUSCULAR | Status: DC | PRN
  Administered 2021-07-31: 10 mL

## 2021-07-31 MED ORDER — LACTATED RINGERS IV SOLN: INTRAVENOUS | Status: DC

## 2021-07-31 MED ORDER — BACITRACIN ZINC 500 UNIT/GM EX OINT
TOPICAL_OINTMENT | CUTANEOUS | Status: AC
Start: 2021-07-31 — End: ?
  Filled 2021-07-31: qty 28.35

## 2021-07-31 MED ORDER — FENTANYL CITRATE (PF) 250 MCG/5ML IJ SOLN
INTRAMUSCULAR | Status: DC | PRN
  Administered 2021-07-31 (×2): 50 ug via INTRAVENOUS

## 2021-07-31 MED ORDER — LIDOCAINE 2% (20 MG/ML) 5 ML SYRINGE
INTRAMUSCULAR | Status: DC | PRN
  Administered 2021-07-31: 60 mg via INTRAVENOUS

## 2021-07-31 MED ORDER — ONDANSETRON HCL 4 MG/2ML IJ SOLN
INTRAMUSCULAR | Status: DC | PRN
  Administered 2021-07-31: 4 mg via INTRAVENOUS

## 2021-07-31 MED ORDER — FENTANYL CITRATE (PF) 100 MCG/2ML IJ SOLN: 25.0000 ug | INTRAMUSCULAR | Status: DC | PRN

## 2021-07-31 MED ORDER — CEFAZOLIN SODIUM-DEXTROSE 2-4 GM/100ML-% IV SOLN
2.0000 g | INTRAVENOUS | Status: AC
  Administered 2021-07-31: 2 g via INTRAVENOUS
  Filled 2021-07-31: qty 100

## 2021-07-31 SURGICAL SUPPLY — 43 items
APL SKNCLS STERI-STRIP NONHPOA (GAUZE/BANDAGES/DRESSINGS) ×1
BAG COUNTER SPONGE SURGICOUNT (BAG) ×2 IMPLANT
BAG SPNG CNTER NS LX DISP (BAG) ×1
BENZOIN TINCTURE PRP APPL 2/3 (GAUZE/BANDAGES/DRESSINGS) ×2 IMPLANT
BLADE CLIPPER SURG (BLADE) IMPLANT
BLADE SURG 15 STRL LF DISP TIS (BLADE) ×1 IMPLANT
BLADE SURG 15 STRL SS (BLADE) ×2
CARTRIDGE OIL MAESTRO DRILL (MISCELLANEOUS) ×1 IMPLANT
CEMENT KYPHON C01A KIT/MIXER (Cement) ×1 IMPLANT
DEVICE BIOPSY BONE KYPHX (INSTRUMENTS) ×1 IMPLANT
DIFFUSER DRILL AIR PNEUMATIC (MISCELLANEOUS) ×1 IMPLANT
DRAPE C-ARM 42X72 X-RAY (DRAPES) ×2 IMPLANT
DRAPE HALF SHEET 40X57 (DRAPES) ×2 IMPLANT
DRAPE INCISE IOBAN 66X45 STRL (DRAPES) ×2 IMPLANT
DRAPE LAPAROTOMY 100X72X124 (DRAPES) ×2 IMPLANT
DRAPE SURG 17X23 STRL (DRAPES) ×8 IMPLANT
DRAPE WARM FLUID 44X44 (DRAPES) ×2 IMPLANT
GAUZE 4X4 16PLY ~~LOC~~+RFID DBL (SPONGE) ×2 IMPLANT
GAUZE SPONGE 4X4 12PLY STRL (GAUZE/BANDAGES/DRESSINGS) ×2 IMPLANT
GLOVE BIO SURGEON STRL SZ8 (GLOVE) ×2 IMPLANT
GLOVE BIO SURGEON STRL SZ8.5 (GLOVE) ×2 IMPLANT
GLOVE EXAM NITRILE XL STR (GLOVE) IMPLANT
GOWN STRL REUS W/ TWL LRG LVL3 (GOWN DISPOSABLE) IMPLANT
GOWN STRL REUS W/ TWL XL LVL3 (GOWN DISPOSABLE) IMPLANT
GOWN STRL REUS W/TWL LRG LVL3 (GOWN DISPOSABLE)
GOWN STRL REUS W/TWL XL LVL3 (GOWN DISPOSABLE)
INTRODUCER DEVICE OSTEO LEVEL (INTRODUCER) ×1 IMPLANT
KIT BASIN OR (CUSTOM PROCEDURE TRAY) ×2 IMPLANT
KIT OSTEOCOOL BONE ACCESS 8G (MISCELLANEOUS) ×1 IMPLANT
KIT TURNOVER KIT B (KITS) ×2 IMPLANT
NEEDLE HYPO 22GX1.5 SAFETY (NEEDLE) ×2 IMPLANT
NS IRRIG 1000ML POUR BTL (IV SOLUTION) ×2 IMPLANT
OIL CARTRIDGE MAESTRO DRILL (MISCELLANEOUS)
PACK EENT II TURBAN DRAPE (CUSTOM PROCEDURE TRAY) ×2 IMPLANT
PAD ARMBOARD 7.5X6 YLW CONV (MISCELLANEOUS) ×6 IMPLANT
SPECIMEN JAR SMALL (MISCELLANEOUS) IMPLANT
STRIP CLOSURE SKIN 1/2X4 (GAUZE/BANDAGES/DRESSINGS) ×2 IMPLANT
SUT VIC AB 3-0 SH 8-18 (SUTURE) ×2 IMPLANT
SYR CONTROL 10ML LL (SYRINGE) ×2 IMPLANT
TAPE PAPER 3X10 WHT MICROPORE (GAUZE/BANDAGES/DRESSINGS) ×1 IMPLANT
TOWEL GREEN STERILE (TOWEL DISPOSABLE) ×2 IMPLANT
TOWEL GREEN STERILE FF (TOWEL DISPOSABLE) ×2 IMPLANT
TRAY KYPHOPAK 15/3 ONESTEP 1ST (MISCELLANEOUS) ×2 IMPLANT

## 2021-07-31 NOTE — H&P (Signed)
Subjective: The patient is an 81 year old osteoporotic white female whose had a previous kyphoplasty at L2.  She did well until recently when she had increasing back pain.  She failed medical management and worked up with a thoracic MRI which demonstrated an acute T10, T11 and T12 fracture.  I discussed the various treatment options with her.  She has decided proceed with kyphoplasty's.  Past Medical History:  Diagnosis Date   Actinic keratosis    Arthritis    Basal cell carcinoma 10/21/2018   Left superior forehead. Pigmented. Tx: EDC   Basal cell carcinoma 04/25/2020   left upper lip, EDC    Cancer ([place])    basal cell forehead   Diabetes mellitus without complication ([place])    type II   Hyperlipidemia    Hypertension    Lichen sclerosus    Rosacea    Vitiligo     Past Surgical History:  Procedure Laterality Date   BREAST EXCISIONAL BIOPSY Left 1991   NEG   BREAST SURGERY Left 1991   lumpectomy   CATARACT EXTRACTION, BILATERAL  11/2010   COLONOSCOPY  2015   DILATION AND CURETTAGE OF UTERUS  07/01/2009   Dr. Laurey Morale; hysteroscopy   EYE SURGERY Left 10/22/2012   cataract extraction   KYPHOPLASTY N/A 04/29/2018   Procedure: KYPHOPLASTY L2, DIABETIC;  Surgeon: Hessie Knows, MD;  Location: [facility];  Service: Orthopedics;  Laterality: N/A;   LUMBAR LAMINECTOMY/DECOMPRESSION MICRODISCECTOMY N/A 11/10/2018   Procedure: LAMINECTOMY AND FORAMINOTOMY LUMBAR TWO- LUMBAR THREE LUMBAR THREE- LUMBAR FOUR, LUMBAR FOUR- LUMBAR FIVE;  Surgeon: Newman Pies, MD;  Location: [place] [facility];  Service: Neurosurgery;  Laterality: N/A;   ROTATOR CUFF REPAIR Right 2011   TUBAL LIGATION      Allergies  Allergen Reactions   Codeine Nausea Only   Valsartan Other (See Comments)    Restless, tense and insomnic.   Nitrofurantoin Monohyd Macro Rash    Social History   Tobacco Use   Smoking status: Former    Types: Cigarettes    Quit date: 02/24/1971    Years since quitting: 50.4   Smokeless tobacco:  Never  Substance Use Topics   Alcohol use: Yes    Comment: Occasional beer    Family History  Problem Relation Age of Onset   Diabetes Son        insulin dependent   Arthritis Mother    Breast cancer Neg Hx    Prior to Admission medications   Medication Sig Start Date End Date Taking? Authorizing Provider  amLODipine (NORVASC) 5 MG tablet Take 5 mg by mouth daily.   Yes [provider]  atorvastatin (LIPITOR) 10 MG tablet Take 10 mg by mouth daily. 06/24/21  Yes [provider]  Calcium Carb-Cholecalciferol ([employer] CALCIUM 600 & VITAMIN D3 PO) Take 1 tablet by mouth in the morning and at bedtime.   Yes [provider]  denosumab (PROLIA) 60 MG/ML SOSY injection Inject 60 mg into the skin every 6 (six) months.   Yes [provider]  famotidine (PEPCID) 40 MG tablet Take 40 mg by mouth daily. 07/05/21  Yes [provider]  HYDROcodone-acetaminophen (NORCO/VICODIN) 5-325 MG tablet Take 1 tablet by mouth every 4 (four) hours as needed for severe pain. 07/02/21  Yes [provider]  insulin aspart (NOVOLOG PENFILL) cartridge Inject 5-8 Units into the skin 3 (three) times daily with meals.   Yes [provider]  Lactobacillus (DIGESTIVE HEALTH PROBIOTIC) CAPS Take 1 capsule by mouth daily.  Yes [provider]  lisinopril (ZESTRIL) 40 MG tablet Take 40 mg by mouth daily.   Yes [provider]  magnesium gluconate (MAGONATE) 500 MG tablet Take 500 mg by mouth daily.   Yes [provider]  meloxicam (MOBIC) 15 MG tablet Take 15 mg by mouth daily. 07/05/21  Yes [provider]  Multiple Vitamin tablet Take 1 tablet by mouth daily.    Yes [provider]  tiZANidine (ZANAFLEX) 2 MG tablet Take 2 mg by mouth 3 (three) times daily as needed for muscle spasms. 07/24/21  Yes [provider]  TOUJEO SOLOSTAR 300 UNIT/ML Solostar Pen Inject 9 Units into the skin daily before lunch. 07/02/21  Yes  [provider]  traMADol (ULTRAM) 50 MG tablet Take 50 mg by mouth every 6 (six) hours as needed for severe pain.   Yes [provider]  Vitamin D, Ergocalciferol, (DRISDOL) 50000 units CAPS capsule Take 50,000 Units by mouth every 30 (thirty) days.   Yes [provider]  glucose blood test strip  02/20/14   [provider]  permethrin (ELIMITE) 5 % cream Apply 1 application topically at bedtime. For face for rosacea Patient taking differently: Apply 1 application. topically at bedtime as needed (For face for rosacea). 01/01/21   Brendolyn Patty, MD     Review of Systems  Positive ROS: As above  All other systems have been reviewed and were otherwise negative with the exception of those mentioned in the HPI and as above.  Objective: Vital signs in last 24 hours: Temp:  [97.6 F (36.4 C)] 97.6 F (36.4 C) (06/07 0702) Pulse Rate:  [77] 77 (06/07 0702) Resp:  [20] 20 (06/07 0702) BP: (153)/(79) 153/79 (06/07 0702) SpO2:  [98 %] 98 % (06/07 0702) Weight:  [50.8 kg] 50.8 kg (06/07 0702) Estimated body mass index is 19.22 kg/m as calculated from the following:   Height as of this encounter: '5\' 4"'$  (1.626 m).   Weight as of this encounter: 50.8 kg.   General Appearance: Alert Head: Normocephalic, without obvious abnormality, atraumatic Eyes: PERRL, conjunctiva/corneas clear, EOM's intact,    Ears: Normal  Throat: Normal  Neck: Supple, Back: Her previous kyphoplasty incisions are well-healed. Lungs: Clear to auscultation bilaterally, respirations unlabored Heart: Regular rate and rhythm, no murmur, rub or gallop Abdomen: Soft, non-tender Extremities: Extremities normal, atraumatic, no cyanosis or edema Skin: unremarkable  NEUROLOGIC:   Mental status: alert and oriented,Motor Exam - grossly normal Sensory Exam - grossly normal Reflexes:  Coordination - grossly normal Gait - grossly normal Balance - grossly normal Cranial Nerves: I: smell  Not tested  II: visual acuity  OS: Normal  OD: Normal   II: visual fields Full to confrontation  II: pupils Equal, round, reactive to light  III,VII: ptosis None  III,IV,VI: extraocular muscles  Full ROM  V: mastication Normal  V: facial light touch sensation  Normal  V,VII: corneal reflex  Present  VII: facial muscle function - upper  Normal  VII: facial muscle function - lower Normal  VIII: hearing Not tested  IX: soft palate elevation  Normal  IX,X: gag reflex Present  XI: trapezius strength  5/5  XI: sternocleidomastoid strength 5/5  XI: neck flexion strength  5/5  XII: tongue strength  Normal    Data Review Lab Results  Component Value Date   WBC 5.7 07/31/2021   HGB 14.2 07/31/2021   HCT 42.3 07/31/2021   MCV 90.2 07/31/2021   PLT 318 07/31/2021  Lab Results  Component Value Date   NA 132 (L) 07/31/2021   K 4.0 07/31/2021   CL 97 (L) 07/31/2021   CO2 24 07/31/2021   BUN 17 07/31/2021   CREATININE 0.91 07/31/2021   GLUCOSE 178 (H) 07/31/2021   No results found for: INR, PROTIME  Assessment/Plan: T10, T11, and T12 compression fracture, osteoporosis, thoracic spine pain: I have discussed the situation with the patient.  We discussed the various treatment options including surgery.  I described the surgical treatment option of a T10, T11 and T12 kyphoplasty.  I have given her a surgical pamphlet.  We have discussed the risk, benefits, alternatives, expected postop course, and likelihood of achieving our goals with surgery.  I have answered all her questions.  She has decided proceed with surgery.   Ophelia Charter 07/31/2021 9:12 AM

## 2021-07-31 NOTE — Op Note (Signed)
Brief history: The patient is an 81 year old white female who has a previous L2 compression fracture which was successfully treated with kyphoplasty.  She is developed thoracic spine pain.  She has failed medical management.  She was worked up with thoracic x-rays and a thoracic MRI which demonstrated acute compression fractures at T10, T11 and T12.  I discussed the various treatment options with her.  She has decided proceed with a kyphoplasty.  Preop diagnosis: T10, T11 and T12 compression fractures, thoracic spine pain  Postop diagnosis: The same  Procedure: Bilateral T10, T11 and T12 kyphoplasties; T12 vertebral body biopsy  Surgeon: Dr. Earle Gell  Assistant: None  Anesthesia: General endotracheal  Estimated blood loss: 50 cc  Specimens: Left T12 vertebral body biopsy  Drains: None  Complications: None  Description of procedure: The patient was brought to the operating room by the anesthesia team.  General endotracheal anesthesia was induced.  She was turned to the prone position on the chest rolls.  Her thoracic region was then prepared with Betadine scrub and Betadine solution.  I then injected the area to be incised with Marcaine with epinephrine solution.  I used a 15 blade scalpel to make small incisions over the bilateral T10, T11 and T12 pedicles.  I cannulated the bilateral T10, T11 and T12 pedicles with a Jamshidi needle using AP and lateral fluoroscopy.  I obtained a left T12 vertebral body biopsy.  I then remove the inner trocar and drilled the vertebral body via the pedicle cannulas bilaterally at T10, T11 and T12.  We then inserted balloons inflated balloons, deflated balloons bilaterally at T10, T11 and the left at T12.  We cannot get the balloon into the vertebral body on the right at T12.  We then removed the balloons.  I then inserted bone cement, under AP and lateral fluoroscopic guidance into the bilateral at T10 and T11 vertebral bodies, and on the left at T12.   After we were satisfied with the cement filling, I removed the trocars.  I then reapproximated patient's subcutaneous tissue with interrupted 3-0 Vicryl suture.  I reapproximated the skin with Steri-Strips and benzoin.  The wound was then coated with bacitracin ointment.  A sterile dressing was applied.  The drapes were removed.  By report all sponge, instrument, and needle counts were correct at the end of this case.

## 2021-07-31 NOTE — Anesthesia Procedure Notes (Signed)
Procedure Name: Intubation Date/Time: 07/31/2021 9:42 AM Performed by: Georgia Duff, CRNA Pre-anesthesia Checklist: Patient identified, Emergency Drugs available, Suction available and Patient being monitored Patient Re-evaluated:Patient Re-evaluated prior to induction Oxygen Delivery Method: Circle System Utilized Preoxygenation: Pre-oxygenation with 100% oxygen Induction Type: IV induction Ventilation: Mask ventilation without difficulty Laryngoscope Size: Miller and 2 Grade View: Grade I Tube type: Oral Tube size: 7.0 mm Number of attempts: 1 Airway Equipment and Method: Stylet and Oral airway Placement Confirmation: ETT inserted through vocal cords under direct vision, positive ETCO2 and breath sounds checked- equal and bilateral Secured at: 21 cm Tube secured with: Tape Dental Injury: Teeth and Oropharynx as per pre-operative assessment

## 2021-07-31 NOTE — Transfer of Care (Signed)
Immediate Anesthesia Transfer of Care Note  Patient: Denise Sherman  Procedure(s) Performed: KYPHOPLASTY THORACIC TEN, THORACIC ELEVEN , THORACIC TWELVE  Patient Location: PACU  Anesthesia Type:General  Level of Consciousness: drowsy and patient cooperative  Airway & Oxygen Therapy: Patient Spontanous Breathing  Post-op Assessment: Report given to RN and Post -op Vital signs reviewed and stable  Post vital signs: Reviewed and stable  Last Vitals:  Vitals Value Taken Time  BP 129/72 07/31/21 1117  Temp    Pulse 66 07/31/21 1124  Resp 12 07/31/21 1125  SpO2 97 % 07/31/21 1124  Vitals shown include unvalidated device data.  Last Pain:  Vitals:   07/31/21 0724  TempSrc:   PainSc: 0-No pain         Complications: No notable events documented.

## 2021-07-31 NOTE — Anesthesia Preprocedure Evaluation (Signed)
Anesthesia Evaluation  Patient identified by MRN, date of birth, ID band Patient awake    Reviewed: Allergy & Precautions, NPO status , Patient's Chart, lab work & pertinent test results  Airway Mallampati: II  TM Distance: >3 FB Neck ROM: Full    Dental no notable dental hx.    Pulmonary neg pulmonary ROS, former smoker,    Pulmonary exam normal breath sounds clear to auscultation       Cardiovascular hypertension, Normal cardiovascular exam Rhythm:Regular Rate:Normal     Neuro/Psych negative neurological ROS  negative psych ROS   GI/Hepatic negative GI ROS, Neg liver ROS,   Endo/Other  diabetes  Renal/GU negative Renal ROS  negative genitourinary   Musculoskeletal negative musculoskeletal ROS (+)   Abdominal   Peds negative pediatric ROS (+)  Hematology negative hematology ROS (+)   Anesthesia Other Findings   Reproductive/Obstetrics negative OB ROS                             Anesthesia Physical Anesthesia Plan  ASA: 2  Anesthesia Plan: General   Post-op Pain Management: Minimal or no pain anticipated   Induction: Intravenous  PONV Risk Score and Plan: 3 and Ondansetron, Dexamethasone and Treatment may vary due to age or medical condition  Airway Management Planned: Oral ETT  Additional Equipment:   Intra-op Plan:   Post-operative Plan: Extubation in OR  Informed Consent: I have reviewed the patients History and Physical, chart, labs and discussed the procedure including the risks, benefits and alternatives for the proposed anesthesia with the patient or authorized representative who has indicated his/her understanding and acceptance.     Dental advisory given  Plan Discussed with: CRNA and Surgeon  Anesthesia Plan Comments:         Anesthesia Quick Evaluation

## 2021-07-31 NOTE — Anesthesia Postprocedure Evaluation (Signed)
Anesthesia Post Note  Patient: Denise Sherman  Procedure(s) Performed: KYPHOPLASTY THORACIC TEN, THORACIC ELEVEN , THORACIC TWELVE     Patient location during evaluation: PACU Anesthesia Type: General Level of consciousness: awake and alert Pain management: pain level controlled Vital Signs Assessment: post-procedure vital signs reviewed and stable Respiratory status: spontaneous breathing, nonlabored ventilation, respiratory function stable and patient connected to nasal cannula oxygen Cardiovascular status: blood pressure returned to baseline and stable Postop Assessment: no apparent nausea or vomiting Anesthetic complications: no   No notable events documented.  Last Vitals:  Vitals:   07/31/21 1135 07/31/21 1150  BP: (!) 162/79 (!) 151/77  Pulse: 60 (!) 58  Resp: (!) 8 15  Temp:  (!) 36.4 C  SpO2: 97% 100%    Last Pain:  Vitals:   07/31/21 1150  TempSrc:   PainSc: 0-No pain                 Glena Pharris S

## 2021-07-31 NOTE — OR Nursing (Signed)
[place] USED EXPIRES 01/24/2024 LOT NUMBER [id number]

## 2021-08-01 ENCOUNTER — Encounter (HOSPITAL_COMMUNITY): Payer: Self-pay | Admitting: Neurosurgery

## 2021-08-02 LAB — SURGICAL PATHOLOGY

## 2021-08-07 DIAGNOSIS — S22080D Wedge compression fracture of T11-T12 vertebra, subsequent encounter for fracture with routine healing: Secondary | ICD-10-CM | POA: Diagnosis not present

## 2021-08-16 DIAGNOSIS — S22080G Wedge compression fracture of T11-T12 vertebra, subsequent encounter for fracture with delayed healing: Secondary | ICD-10-CM | POA: Diagnosis not present

## 2021-08-16 DIAGNOSIS — Z681 Body mass index (BMI) 19 or less, adult: Secondary | ICD-10-CM | POA: Diagnosis not present

## 2021-09-30 DIAGNOSIS — I10 Essential (primary) hypertension: Secondary | ICD-10-CM | POA: Diagnosis not present

## 2021-09-30 DIAGNOSIS — E1069 Type 1 diabetes mellitus with other specified complication: Secondary | ICD-10-CM | POA: Diagnosis not present

## 2021-09-30 DIAGNOSIS — Z79899 Other long term (current) drug therapy: Secondary | ICD-10-CM | POA: Diagnosis not present

## 2021-09-30 DIAGNOSIS — E785 Hyperlipidemia, unspecified: Secondary | ICD-10-CM | POA: Diagnosis not present

## 2021-10-07 DIAGNOSIS — E7800 Pure hypercholesterolemia, unspecified: Secondary | ICD-10-CM | POA: Diagnosis not present

## 2021-10-07 DIAGNOSIS — E1042 Type 1 diabetes mellitus with diabetic polyneuropathy: Secondary | ICD-10-CM | POA: Diagnosis not present

## 2021-10-07 DIAGNOSIS — I10 Essential (primary) hypertension: Secondary | ICD-10-CM | POA: Diagnosis not present

## 2021-10-07 DIAGNOSIS — Z79899 Other long term (current) drug therapy: Secondary | ICD-10-CM | POA: Diagnosis not present

## 2021-10-22 DIAGNOSIS — U071 COVID-19: Secondary | ICD-10-CM | POA: Diagnosis not present

## 2021-11-07 DIAGNOSIS — H40003 Preglaucoma, unspecified, bilateral: Secondary | ICD-10-CM | POA: Diagnosis not present

## 2021-12-03 DIAGNOSIS — S22080G Wedge compression fracture of T11-T12 vertebra, subsequent encounter for fracture with delayed healing: Secondary | ICD-10-CM | POA: Diagnosis not present

## 2022-01-13 ENCOUNTER — Ambulatory Visit: Payer: HMO | Admitting: Dermatology

## 2022-01-13 DIAGNOSIS — D2239 Melanocytic nevi of other parts of face: Secondary | ICD-10-CM | POA: Diagnosis not present

## 2022-01-13 DIAGNOSIS — L570 Actinic keratosis: Secondary | ICD-10-CM | POA: Diagnosis not present

## 2022-01-13 DIAGNOSIS — L80 Vitiligo: Secondary | ICD-10-CM | POA: Diagnosis not present

## 2022-01-13 DIAGNOSIS — L820 Inflamed seborrheic keratosis: Secondary | ICD-10-CM | POA: Diagnosis not present

## 2022-01-13 DIAGNOSIS — D2261 Melanocytic nevi of right upper limb, including shoulder: Secondary | ICD-10-CM

## 2022-01-13 DIAGNOSIS — D224 Melanocytic nevi of scalp and neck: Secondary | ICD-10-CM

## 2022-01-13 DIAGNOSIS — Z1283 Encounter for screening for malignant neoplasm of skin: Secondary | ICD-10-CM | POA: Diagnosis not present

## 2022-01-13 DIAGNOSIS — D229 Melanocytic nevi, unspecified: Secondary | ICD-10-CM

## 2022-01-13 DIAGNOSIS — D2271 Melanocytic nevi of right lower limb, including hip: Secondary | ICD-10-CM

## 2022-01-13 DIAGNOSIS — L578 Other skin changes due to chronic exposure to nonionizing radiation: Secondary | ICD-10-CM

## 2022-01-13 DIAGNOSIS — D225 Melanocytic nevi of trunk: Secondary | ICD-10-CM

## 2022-01-13 DIAGNOSIS — D2272 Melanocytic nevi of left lower limb, including hip: Secondary | ICD-10-CM

## 2022-01-13 DIAGNOSIS — L821 Other seborrheic keratosis: Secondary | ICD-10-CM

## 2022-01-13 DIAGNOSIS — Z85828 Personal history of other malignant neoplasm of skin: Secondary | ICD-10-CM

## 2022-01-13 DIAGNOSIS — D2262 Melanocytic nevi of left upper limb, including shoulder: Secondary | ICD-10-CM

## 2022-01-13 DIAGNOSIS — L719 Rosacea, unspecified: Secondary | ICD-10-CM | POA: Diagnosis not present

## 2022-01-13 NOTE — Patient Instructions (Addendum)
Cryotherapy Aftercare  Wash gently with soap and water everyday.   Apply Vaseline and Band-Aid daily until healed.   Photodynamic Therapy/Blue Light Therapy  Actinic keratoses are the dry, red scaly spots on the skin caused by sun damage. A portion of these spots can turn into skin cancer with time, and treating them can help prevent development of skin cancer.   Treatment of these spots requires removal of the defective skin cells. There are various ways to remove actinic keratoses, including freezing with liquid nitrogen, treatment with creams, or treatment with a blue light procedure in the office.   Photodynamic Therapy (PDT), also known as "blue light therapy" is an in office procedure used to treat actinic keratoses. It works by targeting precancerous cells. After treatment, these cells peel off and are replaced by healthy ones.   For your phototherapy appointment, you will have two appointments on the day of your treatment. The first appointment will be to apply a cream to the treatment area. You will leave this cream on for 1-2 hours depending on the area being treated. The second appointment will be to shine a blue light on the area for 16 minutes to kill off the precancer cells. It is common to experience a burning sensation during the treatment.  After your treatment, it will be important to keep the treated areas of skin out of the sun completely for 48-72 hours (2-3 days) to prevent having a reaction.   Common side effects include: - Burning or stinging, which may be severe and can last up to 24-72 hours after your treatment - Scaling and crusting which may last up to 2 weeks - Redness, swelling and/or peeling which can last up to 4 weeks  To Care for Your Skin After PDT/Blue Light Therapy: - Wash with soap, water and shampoo as normal. - If needed, you can use cold compresses (e.g. ice packs) for comfort - If okay with your primary care doctor, you may use analgesics such as  acetaminophen (tylenol) every 4-6 hours, not to exceed recommended dose - You may apply Cerave Healing Ointment, Vaseline or Aquaphor as needed - If you have a lot of swelling you may take a Benadryl to help with this (this may cause drowsiness), not to exceed recommended dose. This may increase the risk of falls in people over 65 and may slow reaction time while driving, so it is not recommended to take before driving or operating machinery. - Sun Precautions - Wear a wide brim hat for the next week if outside  - Wear a sunblock with zinc or titanium dioxide at least SPF 50 daily  If you have any questions or concerns, please call the office and ask to speak with a nurse.   --------------------------------------------------------------------------------------------------------------   Due to recent changes in healthcare laws, you may see results of your pathology and/or laboratory studies on MyChart before the doctors have had a chance to review them. We understand that in some cases there may be results that are confusing or concerning to you. Please understand that not all results are received at the same time and often the doctors may need to interpret multiple results in order to provide you with the best plan of care or course of treatment. Therefore, we ask that you please give us 2 business days to thoroughly review all your results before contacting the office for clarification. Should we see a critical lab result, you will be contacted sooner.   If You Need Anything After   Your Visit  If you have any questions or concerns for your doctor, please call our main line at [phone] and press option 4 to reach your doctor's medical assistant. If no one answers, please leave a voicemail as directed and we will return your call as soon as possible. Messages left after 4 pm will be answered the following business day.   You may also send us a message via MyChart. We typically respond to MyChart  messages within 1-2 business days.  For prescription refills, please ask your pharmacy to contact our office. Our fax number is [phone].  If you have an urgent issue when the clinic is closed that cannot wait until the next business day, you can page your doctor at the number below.    Please note that while we do our best to be available for urgent issues outside of office hours, we are not available 24/7.   If you have an urgent issue and are unable to reach us, you may choose to seek medical care at your doctor's office, retail clinic, urgent care center, or emergency room.  If you have a medical emergency, please immediately call 911 or go to the emergency department.  Pager Numbers  - Dr. Kowalski: [phone]  - Dr. Moye: [phone]  - Dr. Stewart: [phone]  In the event of inclement weather, please call our main line at [phone] for an update on the status of any delays or closures.  Dermatology Medication Tips: Please keep the boxes that topical medications come in in order to help keep track of the instructions about where and how to use these. Pharmacies typically print the medication instructions only on the boxes and not directly on the medication tubes.   If your medication is too expensive, please contact our office at [phone] option 4 or send us a message through MyChart.   We are unable to tell what your co-pay for medications will be in advance as this is different depending on your insurance coverage. However, we may be able to find a substitute medication at lower cost or fill out paperwork to get insurance to cover a needed medication.   If a prior authorization is required to get your medication covered by your insurance company, please allow us 1-2 business days to complete this process.  Drug prices often vary depending on where the prescription is filled and some pharmacies may offer cheaper prices.  The website www.goodrx.com contains  coupons for medications through different pharmacies. The prices here do not account for what the cost may be with help from insurance (it may be cheaper with your insurance), but the website can give you the price if you did not use any insurance.  - You can print the associated coupon and take it with your prescription to the pharmacy.  - You may also stop by our office during regular business hours and pick up a GoodRx coupon card.  - If you need your prescription sent electronically to a different pharmacy, notify our office through [facility] MyChart or by phone at [phone] option 4.     Si Usted Necesita Algo Despus de Su Visita  Tambin puede enviarnos un mensaje a travs de MyChart. Por lo general respondemos a los mensajes de MyChart en el transcurso de 1 a 2 das hbiles.  Para renovar recetas, por favor pida a su farmacia que se ponga en contacto con nuestra oficina. Nuestro nmero de fax es el [phone].  Si tiene un asunto urgente cuando   la clnica est cerrada y que no puede esperar hasta el siguiente da hbil, puede llamar/localizar a su doctor(a) al nmero que aparece a continuacin.   Por favor, tenga en cuenta que aunque hacemos todo lo posible para estar disponibles para asuntos urgentes fuera del horario de oficina, no estamos disponibles las 24 horas del da, los 7 das de la semana.   Si tiene un problema urgente y no puede comunicarse con nosotros, puede optar por buscar atencin mdica  en el consultorio de su doctor(a), en una clnica privada, en un centro de atencin urgente o en una sala de emergencias.  Si tiene una emergencia mdica, por favor llame inmediatamente al 911 o vaya a la sala de emergencias.  Nmeros de bper  - Dr. Kowalski: [phone]  - Dra. Moye: [phone]  - Dra. Stewart: [phone]  En caso de inclemencias del tiempo, por favor llame a nuestra lnea principal al [phone] para una actualizacin sobre el estado de  cualquier retraso o cierre.  Consejos para la medicacin en dermatologa: Por favor, guarde las cajas en las que vienen los medicamentos de uso tpico para ayudarle a seguir las instrucciones sobre dnde y cmo usarlos. Las farmacias generalmente imprimen las instrucciones del medicamento slo en las cajas y no directamente en los tubos del medicamento.   Si su medicamento es muy caro, por favor, pngase en contacto con nuestra oficina llamando al [phone] y presione la opcin 4 o envenos un mensaje a travs de MyChart.   No podemos decirle cul ser su copago por los medicamentos por adelantado ya que esto es diferente dependiendo de la cobertura de su seguro. Sin embargo, es posible que podamos encontrar un medicamento sustituto a menor costo o llenar un formulario para que el seguro cubra el medicamento que se considera necesario.   Si se requiere una autorizacin previa para que su compaa de seguros cubra su medicamento, por favor permtanos de 1 a 2 das hbiles para completar este proceso.  Los precios de los medicamentos varan con frecuencia dependiendo del lugar de dnde se surte la receta y alguna farmacias pueden ofrecer precios ms baratos.  El sitio web www.goodrx.com tiene cupones para medicamentos de diferentes farmacias. Los precios aqu no tienen en cuenta lo que podra costar con la ayuda del seguro (puede ser ms barato con su seguro), pero el sitio web puede darle el precio si no utiliz ningn seguro.  - Puede imprimir el cupn correspondiente y llevarlo con su receta a la farmacia.  - Tambin puede pasar por nuestra oficina durante el horario de atencin regular y recoger una tarjeta de cupones de GoodRx.  - Si necesita que su receta se enve electrnicamente a una farmacia diferente, informe a nuestra oficina a travs de MyChart de [facility] o por telfono llamando al [phone] y presione la opcin 4.  

## 2022-01-13 NOTE — Progress Notes (Signed)
Follow-Up Visit   Subjective  Denise Sherman is a 81 y.o. female who presents for the following: Total body skin exam (Hx of BCC L upper lip, L sup forehead, hx of Aks), Rosacea (Face, Elimite cr prn), and check spots (Face, scaly spots/L back, long hx, itching). Long h/o vitiligo.  The patient presents for Total-Body Skin Exam (TBSE) for skin cancer screening and mole check.  The patient has spots, moles and lesions to be evaluated, some may be new or changing and the patient has concerns that these could be cancer.   The following portions of the chart were reviewed this encounter and updated as appropriate:       Review of Systems:  No other skin or systemic complaints except as noted in HPI or Assessment and Plan.  Objective  Well appearing patient in no apparent distress; mood and affect are within normal limits.  A full examination was performed including scalp, head, eyes, ears, nose, lips, neck, chest, axillae, abdomen, back, buttocks, bilateral upper extremities, bilateral lower extremities, hands, feet, fingers, toes, fingernails, and toenails. All findings within normal limits unless otherwise noted below.  face Mild erythema malar cheeks with telangiectasias  R paranasal x 1 Pink scaly macule adjacent to hypopigmented macule  neck, trunk, arms Hypopigmented patches and macules trunk, arms, neck  L mid back x 1, post crown x 1 (2) Stuck on waxy paps with erythema  L lower neck  L lower neck Flesh pap    Assessment & Plan   Seborrheic Keratoses - Stuck-on, waxy, tan-brown macule and papules - Benign-appearing - Discussed benign etiology and prognosis. - Observe - Call for any changes - face, arms, legs, back  Melanocytic Nevi - Tan-brown and/or pink-flesh-colored symmetric macules and papules - Benign appearing on exam today - Observation - Call clinic for new or changing moles - Recommend daily use of broad spectrum spf 30+ sunscreen to sun-exposed  areas.   Actinic Damage with PreCancerous Actinic Keratoses Counseling for Topical Chemotherapy Management: Patient exhibits: - Severe, confluent actinic changes with pre-cancerous actinic keratoses that is secondary to cumulative UV radiation exposure over time - Condition that is severe; chronic, not at goal. - diffuse scaly erythematous macules and papules with underlying dyspigmentation - Discussed Prescription "Field Treatment" topical Chemotherapy for Severe, Chronic Confluent Actinic Changes with Pre-Cancerous Actinic Keratoses Field treatment involves treatment of an entire area of skin that has confluent Actinic Changes (Sun/ Ultraviolet light damage) and PreCancerous Actinic Keratoses by method of PhotoDynamic Therapy (PDT) and/or prescription Topical Chemotherapy agents such as 5-fluorouracil, 5-fluorouracil/calcipotriene, and/or imiquimod.  The purpose is to decrease the number of clinically evident and subclinical PreCancerous lesions to prevent progression to development of skin cancer by chemically destroying early precancer changes that may or may not be visible.  It has been shown to reduce the risk of developing skin cancer in the treated area. As a result of treatment, redness, scaling, crusting, and open sores may occur during treatment course. One or more than one of these methods may be used and may have to be used several times to control, suppress and eliminate the PreCancerous changes. Discussed treatment course, expected reaction, and possible side effects. - Recommend daily broad spectrum sunscreen SPF 30+ to sun-exposed areas, reapply every 2 hours as needed.  - Staying in the shade or wearing long sleeves, sun glasses (UVA+UVB protection) and wide brim hats (4-inch brim around the entire circumference of the hat) are also recommended. - Call for new or changing  lesions.  - face, chest - Discussed PDT with ALA to face, pt will schedule, may need two treatments for best  result  Skin cancer screening performed today.   History of Basal Cell Carcinoma of the Skin - No evidence of recurrence today - Recommend regular full body skin exams - Recommend daily broad spectrum sunscreen SPF 30+ to sun-exposed areas, reapply every 2 hours as needed.  - Call if any new or changing lesions are noted between office visits  - L upper lip, L sup forehead  Rosacea face  Chronic condition with duration or expected duration over one year. Currently well-controlled.   Rosacea is a chronic progressive skin condition usually affecting the face of adults, causing redness and/or acne bumps. It is treatable but not curable. It sometimes affects the eyes (ocular rosacea) as well. It may respond to topical and/or systemic medication and can flare with stress, sun exposure, alcohol, exercise, topical steroids (including hydrocortisone/cortisone 10) and some foods.  Daily application of broad spectrum spf 30+ sunscreen to face is recommended to reduce flares.  Cont Permethrin 5% cr qhs  Related Medications permethrin (ELIMITE) 5 % cream Apply 1 application topically at bedtime. For face for rosacea  AK (actinic keratosis) R paranasal x 1  Actinic keratoses are precancerous spots that appear secondary to cumulative UV radiation exposure/sun exposure over time. They are chronic with expected duration over 1 year. A portion of actinic keratoses will progress to squamous cell carcinoma of the skin. It is not possible to reliably predict which spots will progress to skin cancer and so treatment is recommended to prevent development of skin cancer.  Recommend daily broad spectrum sunscreen SPF 30+ to sun-exposed areas, reapply every 2 hours as needed.  Recommend staying in the shade or wearing long sleeves, sun glasses (UVA+UVB protection) and wide brim hats (4-inch brim around the entire circumference of the hat). Call for new or changing lesions.   Destruction of lesion - R  paranasal x 1  Destruction method: cryotherapy   Informed consent: discussed and consent obtained   Lesion destroyed using liquid nitrogen: Yes   Region frozen until ice ball extended beyond lesion: Yes   Outcome: patient tolerated procedure well with no complications   Post-procedure details: wound care instructions given   Additional details:  Prior to procedure, discussed risks of blister formation, small wound, skin dyspigmentation, or rare scar following cryotherapy. Recommend Vaseline ointment to treated areas while healing.   Vitiligo neck, trunk, arms  Stable for years, no treatment  Vitiligo is a chronic autoimmune condition which causes loss of skin pigment and is commonly seen on the face and may also involve areas of trauma like hands, elbows, knees, and ankles. There is no cure and it is difficult to treat.  Treatments include topical steroids and other topical anti-inflammatory ointments/creams and topical and oral Jak inhibitors.  Sometimes narrow band UV light therapy or Xtrac laser is helpful, both of which require twice weekly treatments for at least 3-6 months.  Antioxidant vitamins, such as Vitamins A,C,E,D, Folic Acid and B12 may be added to enhance treatment.  Inflamed seborrheic keratosis (2) L mid back x 1, post crown x 1  Symptomatic, irritating, patient would like treated.  Recheck on f/up- post crown   Destruction of lesion - L mid back x 1, post crown x 1  Destruction method: cryotherapy   Informed consent: discussed and consent obtained   Lesion destroyed using liquid nitrogen: Yes   Region frozen until ice  ball extended beyond lesion: Yes   Outcome: patient tolerated procedure well with no complications   Post-procedure details: wound care instructions given   Additional details:  Prior to procedure, discussed risks of blister formation, small wound, skin dyspigmentation, or rare scar following cryotherapy. Recommend Vaseline ointment to treated areas  while healing.   Nevus L lower neck  Benign-appearing.  Observation.  Call clinic for new or changing moles.  Recommend daily use of broad spectrum spf 30+ sunscreen to sun-exposed areas.     Return in about 6 weeks (around 02/24/2022) for PDT with ALA face 02/2022, 81mf/u AK f/u and recheck ISK scalp, 1 yr TBSE hx of BCC, AKs.  I, Sonya Hupman, RMA, am acting as scribe for TBrendolyn Patty MD .  Documentation: I have reviewed the above documentation for accuracy and completeness, and I agree with the above.  TBrendolyn PattyMD

## 2022-02-25 ENCOUNTER — Ambulatory Visit: Payer: HMO

## 2022-04-04 DIAGNOSIS — E7800 Pure hypercholesterolemia, unspecified: Secondary | ICD-10-CM | POA: Diagnosis not present

## 2022-04-04 DIAGNOSIS — Z79899 Other long term (current) drug therapy: Secondary | ICD-10-CM | POA: Diagnosis not present

## 2022-04-04 DIAGNOSIS — E1042 Type 1 diabetes mellitus with diabetic polyneuropathy: Secondary | ICD-10-CM | POA: Diagnosis not present

## 2022-04-11 DIAGNOSIS — Z1331 Encounter for screening for depression: Secondary | ICD-10-CM | POA: Diagnosis not present

## 2022-04-11 DIAGNOSIS — Z0000 Encounter for general adult medical examination without abnormal findings: Secondary | ICD-10-CM | POA: Diagnosis not present

## 2022-05-15 DIAGNOSIS — M810 Age-related osteoporosis without current pathological fracture: Secondary | ICD-10-CM | POA: Diagnosis not present

## 2022-05-15 DIAGNOSIS — E559 Vitamin D deficiency, unspecified: Secondary | ICD-10-CM | POA: Diagnosis not present

## 2022-05-15 DIAGNOSIS — I152 Hypertension secondary to endocrine disorders: Secondary | ICD-10-CM | POA: Diagnosis not present

## 2022-05-15 DIAGNOSIS — E109 Type 1 diabetes mellitus without complications: Secondary | ICD-10-CM | POA: Diagnosis not present

## 2022-05-15 DIAGNOSIS — E1069 Type 1 diabetes mellitus with other specified complication: Secondary | ICD-10-CM | POA: Diagnosis not present

## 2022-05-15 DIAGNOSIS — E785 Hyperlipidemia, unspecified: Secondary | ICD-10-CM | POA: Diagnosis not present

## 2022-07-23 DIAGNOSIS — M810 Age-related osteoporosis without current pathological fracture: Secondary | ICD-10-CM | POA: Diagnosis not present

## 2022-07-28 ENCOUNTER — Ambulatory Visit: Payer: PPO | Admitting: Dermatology

## 2022-07-28 VITALS — BP 108/53 | HR 64

## 2022-07-28 DIAGNOSIS — Z85828 Personal history of other malignant neoplasm of skin: Secondary | ICD-10-CM

## 2022-07-28 DIAGNOSIS — W908XXA Exposure to other nonionizing radiation, initial encounter: Secondary | ICD-10-CM | POA: Diagnosis not present

## 2022-07-28 DIAGNOSIS — L578 Other skin changes due to chronic exposure to nonionizing radiation: Secondary | ICD-10-CM

## 2022-07-28 DIAGNOSIS — Z7189 Other specified counseling: Secondary | ICD-10-CM

## 2022-07-28 DIAGNOSIS — L905 Scar conditions and fibrosis of skin: Secondary | ICD-10-CM

## 2022-07-28 DIAGNOSIS — L570 Actinic keratosis: Secondary | ICD-10-CM | POA: Diagnosis not present

## 2022-07-28 DIAGNOSIS — X32XXXA Exposure to sunlight, initial encounter: Secondary | ICD-10-CM

## 2022-07-28 DIAGNOSIS — Z79899 Other long term (current) drug therapy: Secondary | ICD-10-CM

## 2022-07-28 NOTE — Progress Notes (Signed)
Follow-Up Visit   Subjective  Denise Sherman is a 82 y.o. female who presents for the following: 6 month AK follow-up of the right paranasal. She states she still feels something in this area. Recheck inflamed sks of the post crown. Patient also has a few new spots on the right nose, left upper lip ,and forehead. History of BCCs of the left upper lip (2022) and left superior forehead (2020).   The following portions of the chart were reviewed this encounter and updated as appropriate: medications, allergies, medical history  Review of Systems:  No other skin or systemic complaints except as noted in HPI or Assessment and Plan.  Objective  Well appearing patient in no apparent distress; mood and affect are within normal limits.   A focused examination was performed of the following areas: Face, scalp, back  Relevant exam findings are noted in the Assessment and Plan.  R paranasal x 3; L malar cheek x 1; glabella x 1; L upper lip x 1; L upper eyebrow x 1; left of midline post crown ant to scar x 1, post to scar x 2; R upper medial eyebrow x 1; upper nasal dorsum x 1 (12) Pink scaly macules.  left anterior crown scalp Smooth white scar with alopecia    Assessment & Plan   ACTINIC DAMAGE WITH PRECANCEROUS ACTINIC KERATOSES Counseling for Topical Chemotherapy Management: Patient exhibits: - Severe, confluent actinic changes with pre-cancerous actinic keratoses that is secondary to cumulative UV radiation exposure over time - Condition that is severe; chronic, not at goal. - diffuse scaly erythematous macules and papules with underlying dyspigmentation - Discussed Prescription "Field Treatment" topical Chemotherapy for Severe, Chronic Confluent Actinic Changes with Pre-Cancerous Actinic Keratoses Field treatment involves treatment of an entire area of skin that has confluent Actinic Changes (Sun/ Ultraviolet light damage) and PreCancerous Actinic Keratoses by method of PhotoDynamic  Therapy (PDT) and/or prescription Topical Chemotherapy agents such as 5-fluorouracil, 5-fluorouracil/calcipotriene, and/or imiquimod.  The purpose is to decrease the number of clinically evident and subclinical PreCancerous lesions to prevent progression to development of skin cancer by chemically destroying early precancer changes that may or may not be visible.  It has been shown to reduce the risk of developing skin cancer in the treated area. As a result of treatment, redness, scaling, crusting, and open sores may occur during treatment course. One or more than one of these methods may be used and may have to be used several times to control, suppress and eliminate the PreCancerous changes. Discussed treatment course, expected reaction, and possible side effects. - Recommend daily broad spectrum sunscreen SPF 30+ to sun-exposed areas, reapply every 2 hours as needed.  - Staying in the shade or wearing long sleeves, sun glasses (UVA+UVB protection) and wide brim hats (4-inch brim around the entire circumference of the hat) are also recommended. - Call for new or changing lesions. - Recommend red light PDT with debridement to face. Patient will schedule.   AK (actinic keratosis) (12) R paranasal x 3; L malar cheek x 1; glabella x 1; L upper lip x 1; L upper eyebrow x 1; left of midline post crown ant to scar x 1, post to scar x 2; R upper medial eyebrow x 1; upper nasal dorsum x 1  vs ISKs.  Actinic keratoses are precancerous spots that appear secondary to cumulative UV radiation exposure/sun exposure over time. They are chronic with expected duration over 1 year. A portion of actinic keratoses will progress to squamous cell  carcinoma of the skin. It is not possible to reliably predict which spots will progress to skin cancer and so treatment is recommended to prevent development of skin cancer.  Recommend daily broad spectrum sunscreen SPF 30+ to sun-exposed areas, reapply every 2 hours as needed.   Recommend staying in the shade or wearing long sleeves, sun glasses (UVA+UVB protection) and wide brim hats (4-inch brim around the entire circumference of the hat). Call for new or changing lesions.  Destruction of lesion - R paranasal x 3; L malar cheek x 1; glabella x 1; L upper lip x 1; L upper eyebrow x 1; left of midline post crown ant to scar x 1, post to scar x 2; R upper medial eyebrow x 1; upper nasal dorsum x 1  Destruction method: cryotherapy   Informed consent: discussed and consent obtained   Lesion destroyed using liquid nitrogen: Yes   Region frozen until ice ball extended beyond lesion: Yes   Outcome: patient tolerated procedure well with no complications   Post-procedure details: wound care instructions given   Additional details:  Prior to procedure, discussed risks of blister formation, small wound, skin dyspigmentation, or rare scar following cryotherapy. Recommend Vaseline ointment to treated areas while healing.   Scar left anterior crown scalp  Biopsy proven AK overlying dermal scar (2019).  Benign. Observation.    Return as scheduled, for TBSE.  ICherlyn Labella, CMA, am acting as scribe for Willeen Niece, MD .   Documentation: I have reviewed the above documentation for accuracy and completeness, and I agree with the above.  Willeen Niece, MD

## 2022-07-28 NOTE — Patient Instructions (Addendum)
Cryotherapy Aftercare  Wash gently with soap and water everyday.   Apply Vaseline and Band-Aid daily until healed.     Due to recent changes in healthcare laws, you may see results of your pathology and/or laboratory studies on MyChart before the doctors have had a chance to review them. We understand that in some cases there may be results that are confusing or concerning to you. Please understand that not all results are received at the same time and often the doctors may need to interpret multiple results in order to provide you with the best plan of care or course of treatment. Therefore, we ask that you please give us 2 business days to thoroughly review all your results before contacting the office for clarification. Should we see a critical lab result, you will be contacted sooner.   If You Need Anything After Your Visit  If you have any questions or concerns for your doctor, please call our main line at [phone] and press option 4 to reach your doctor's medical assistant. If no one answers, please leave a voicemail as directed and we will return your call as soon as possible. Messages left after 4 pm will be answered the following business day.   You may also send us a message via MyChart. We typically respond to MyChart messages within 1-2 business days.  For prescription refills, please ask your pharmacy to contact our office. Our fax number is [phone].  If you have an urgent issue when the clinic is closed that cannot wait until the next business day, you can page your doctor at the number below.    Please note that while we do our best to be available for urgent issues outside of office hours, we are not available 24/7.   If you have an urgent issue and are unable to reach us, you may choose to seek medical care at your doctor's office, retail clinic, urgent care center, or emergency room.  If you have a medical emergency, please immediately call 911 or go to the  emergency department.  Pager Numbers  - Dr. Kowalski: [phone]  - Dr. Moye: [phone]  - Dr. Stewart: [phone]  In the event of inclement weather, please call our main line at [phone] for an update on the status of any delays or closures.  Dermatology Medication Tips: Please keep the boxes that topical medications come in in order to help keep track of the instructions about where and how to use these. Pharmacies typically print the medication instructions only on the boxes and not directly on the medication tubes.   If your medication is too expensive, please contact our office at [phone] option 4 or send us a message through MyChart.   We are unable to tell what your co-pay for medications will be in advance as this is different depending on your insurance coverage. However, we may be able to find a substitute medication at lower cost or fill out paperwork to get insurance to cover a needed medication.   If a prior authorization is required to get your medication covered by your insurance company, please allow us 1-2 business days to complete this process.  Drug prices often vary depending on where the prescription is filled and some pharmacies may offer cheaper prices.  The website www.goodrx.com contains coupons for medications through different pharmacies. The prices here do not account for what the cost may be with help from insurance (it may be cheaper with your insurance), but the website can   give you the price if you did not use any insurance.  - You can print the associated coupon and take it with your prescription to the pharmacy.  - You may also stop by our office during regular business hours and pick up a GoodRx coupon card.  - If you need your prescription sent electronically to a different pharmacy, notify our office through [facility] MyChart or by phone at [phone] option 4.     Si Usted Necesita Algo Despus de Su Visita  Tambin puede  enviarnos un mensaje a travs de MyChart. Por lo general respondemos a los mensajes de MyChart en el transcurso de 1 a 2 das hbiles.  Para renovar recetas, por favor pida a su farmacia que se ponga en contacto con nuestra oficina. Nuestro nmero de fax es el [phone].  Si tiene un asunto urgente cuando la clnica est cerrada y que no puede esperar hasta el siguiente da hbil, puede llamar/localizar a su doctor(a) al nmero que aparece a continuacin.   Por favor, tenga en cuenta que aunque hacemos todo lo posible para estar disponibles para asuntos urgentes fuera del horario de oficina, no estamos disponibles las 24 horas del da, los 7 das de la semana.   Si tiene un problema urgente y no puede comunicarse con nosotros, puede optar por buscar atencin mdica  en el consultorio de su doctor(a), en una clnica privada, en un centro de atencin urgente o en una sala de emergencias.  Si tiene una emergencia mdica, por favor llame inmediatamente al 911 o vaya a la sala de emergencias.  Nmeros de bper  - Dr. Kowalski: [phone]  - Dra. Moye: [phone]  - Dra. Stewart: [phone]  En caso de inclemencias del tiempo, por favor llame a nuestra lnea principal al [phone] para una actualizacin sobre el estado de cualquier retraso o cierre.  Consejos para la medicacin en dermatologa: Por favor, guarde las cajas en las que vienen los medicamentos de uso tpico para ayudarle a seguir las instrucciones sobre dnde y cmo usarlos. Las farmacias generalmente imprimen las instrucciones del medicamento slo en las cajas y no directamente en los tubos del medicamento.   Si su medicamento es muy caro, por favor, pngase en contacto con nuestra oficina llamando al [phone] y presione la opcin 4 o envenos un mensaje a travs de MyChart.   No podemos decirle cul ser su copago por los medicamentos por adelantado ya que esto es diferente dependiendo de la cobertura de su seguro.  Sin embargo, es posible que podamos encontrar un medicamento sustituto a menor costo o llenar un formulario para que el seguro cubra el medicamento que se considera necesario.   Si se requiere una autorizacin previa para que su compaa de seguros cubra su medicamento, por favor permtanos de 1 a 2 das hbiles para completar este proceso.  Los precios de los medicamentos varan con frecuencia dependiendo del lugar de dnde se surte la receta y alguna farmacias pueden ofrecer precios ms baratos.  El sitio web www.goodrx.com tiene cupones para medicamentos de diferentes farmacias. Los precios aqu no tienen en cuenta lo que podra costar con la ayuda del seguro (puede ser ms barato con su seguro), pero el sitio web puede darle el precio si no utiliz ningn seguro.  - Puede imprimir el cupn correspondiente y llevarlo con su receta a la farmacia.  - Tambin puede pasar por nuestra oficina durante el horario de atencin regular y recoger una tarjeta de cupones de GoodRx.  -   Si necesita que su receta se enve electrnicamente a una farmacia diferente, informe a nuestra oficina a travs de MyChart de [facility] o por telfono llamando al [phone] y presione la opcin 4.  

## 2022-09-22 DIAGNOSIS — M47816 Spondylosis without myelopathy or radiculopathy, lumbar region: Secondary | ICD-10-CM | POA: Diagnosis not present

## 2022-09-22 DIAGNOSIS — M5416 Radiculopathy, lumbar region: Secondary | ICD-10-CM | POA: Diagnosis not present

## 2022-09-22 DIAGNOSIS — M5136 Other intervertebral disc degeneration, lumbar region: Secondary | ICD-10-CM | POA: Diagnosis not present

## 2022-09-22 DIAGNOSIS — M48062 Spinal stenosis, lumbar region with neurogenic claudication: Secondary | ICD-10-CM | POA: Diagnosis not present

## 2022-10-07 DIAGNOSIS — Z79899 Other long term (current) drug therapy: Secondary | ICD-10-CM | POA: Diagnosis not present

## 2022-10-07 DIAGNOSIS — E785 Hyperlipidemia, unspecified: Secondary | ICD-10-CM | POA: Diagnosis not present

## 2022-10-07 DIAGNOSIS — E1069 Type 1 diabetes mellitus with other specified complication: Secondary | ICD-10-CM | POA: Diagnosis not present

## 2022-10-07 DIAGNOSIS — E7800 Pure hypercholesterolemia, unspecified: Secondary | ICD-10-CM | POA: Diagnosis not present

## 2022-10-10 DIAGNOSIS — E1069 Type 1 diabetes mellitus with other specified complication: Secondary | ICD-10-CM | POA: Diagnosis not present

## 2022-10-10 DIAGNOSIS — E1042 Type 1 diabetes mellitus with diabetic polyneuropathy: Secondary | ICD-10-CM | POA: Diagnosis not present

## 2022-10-10 DIAGNOSIS — E785 Hyperlipidemia, unspecified: Secondary | ICD-10-CM | POA: Diagnosis not present

## 2022-10-10 DIAGNOSIS — Z79899 Other long term (current) drug therapy: Secondary | ICD-10-CM | POA: Diagnosis not present

## 2022-10-10 DIAGNOSIS — I10 Essential (primary) hypertension: Secondary | ICD-10-CM | POA: Diagnosis not present

## 2022-10-10 DIAGNOSIS — R809 Proteinuria, unspecified: Secondary | ICD-10-CM | POA: Diagnosis not present

## 2022-10-15 DIAGNOSIS — M810 Age-related osteoporosis without current pathological fracture: Secondary | ICD-10-CM | POA: Diagnosis not present

## 2022-10-15 DIAGNOSIS — M48062 Spinal stenosis, lumbar region with neurogenic claudication: Secondary | ICD-10-CM | POA: Diagnosis not present

## 2022-10-15 DIAGNOSIS — M545 Low back pain, unspecified: Secondary | ICD-10-CM | POA: Diagnosis not present

## 2022-10-15 DIAGNOSIS — M6281 Muscle weakness (generalized): Secondary | ICD-10-CM | POA: Diagnosis not present

## 2022-10-15 DIAGNOSIS — M47816 Spondylosis without myelopathy or radiculopathy, lumbar region: Secondary | ICD-10-CM | POA: Diagnosis not present

## 2022-10-15 DIAGNOSIS — M5416 Radiculopathy, lumbar region: Secondary | ICD-10-CM | POA: Diagnosis not present

## 2022-10-23 DIAGNOSIS — I152 Hypertension secondary to endocrine disorders: Secondary | ICD-10-CM | POA: Diagnosis not present

## 2022-10-23 DIAGNOSIS — E109 Type 1 diabetes mellitus without complications: Secondary | ICD-10-CM | POA: Diagnosis not present

## 2022-10-23 DIAGNOSIS — E785 Hyperlipidemia, unspecified: Secondary | ICD-10-CM | POA: Diagnosis not present

## 2022-10-23 DIAGNOSIS — E1069 Type 1 diabetes mellitus with other specified complication: Secondary | ICD-10-CM | POA: Diagnosis not present

## 2022-10-23 DIAGNOSIS — M810 Age-related osteoporosis without current pathological fracture: Secondary | ICD-10-CM | POA: Diagnosis not present

## 2022-10-23 DIAGNOSIS — E1159 Type 2 diabetes mellitus with other circulatory complications: Secondary | ICD-10-CM | POA: Diagnosis not present

## 2022-10-23 DIAGNOSIS — E559 Vitamin D deficiency, unspecified: Secondary | ICD-10-CM | POA: Diagnosis not present

## 2022-10-29 DIAGNOSIS — M47816 Spondylosis without myelopathy or radiculopathy, lumbar region: Secondary | ICD-10-CM | POA: Diagnosis not present

## 2022-10-29 DIAGNOSIS — M6281 Muscle weakness (generalized): Secondary | ICD-10-CM | POA: Diagnosis not present

## 2022-10-29 DIAGNOSIS — M545 Low back pain, unspecified: Secondary | ICD-10-CM | POA: Diagnosis not present

## 2022-10-29 DIAGNOSIS — M48062 Spinal stenosis, lumbar region with neurogenic claudication: Secondary | ICD-10-CM | POA: Diagnosis not present

## 2022-10-29 DIAGNOSIS — M5416 Radiculopathy, lumbar region: Secondary | ICD-10-CM | POA: Diagnosis not present

## 2022-11-10 DIAGNOSIS — H04123 Dry eye syndrome of bilateral lacrimal glands: Secondary | ICD-10-CM | POA: Diagnosis not present

## 2022-11-10 DIAGNOSIS — H43813 Vitreous degeneration, bilateral: Secondary | ICD-10-CM | POA: Diagnosis not present

## 2022-11-10 DIAGNOSIS — Z961 Presence of intraocular lens: Secondary | ICD-10-CM | POA: Diagnosis not present

## 2022-11-10 DIAGNOSIS — E119 Type 2 diabetes mellitus without complications: Secondary | ICD-10-CM | POA: Diagnosis not present

## 2022-12-16 ENCOUNTER — Ambulatory Visit: Payer: PPO

## 2022-12-24 ENCOUNTER — Ambulatory Visit: Payer: PPO

## 2023-01-01 DIAGNOSIS — M810 Age-related osteoporosis without current pathological fracture: Secondary | ICD-10-CM | POA: Diagnosis not present

## 2023-01-05 ENCOUNTER — Ambulatory Visit: Payer: PPO

## 2023-01-26 ENCOUNTER — Encounter: Payer: HMO | Admitting: Dermatology

## 2023-02-11 DIAGNOSIS — M810 Age-related osteoporosis without current pathological fracture: Secondary | ICD-10-CM | POA: Diagnosis not present

## 2023-03-26 DIAGNOSIS — E785 Hyperlipidemia, unspecified: Secondary | ICD-10-CM | POA: Diagnosis not present

## 2023-03-26 DIAGNOSIS — E1069 Type 1 diabetes mellitus with other specified complication: Secondary | ICD-10-CM | POA: Diagnosis not present

## 2023-03-26 DIAGNOSIS — Z79899 Other long term (current) drug therapy: Secondary | ICD-10-CM | POA: Diagnosis not present

## 2023-03-26 DIAGNOSIS — R809 Proteinuria, unspecified: Secondary | ICD-10-CM | POA: Diagnosis not present

## 2023-04-02 DIAGNOSIS — Z79899 Other long term (current) drug therapy: Secondary | ICD-10-CM | POA: Diagnosis not present

## 2023-04-02 DIAGNOSIS — E7800 Pure hypercholesterolemia, unspecified: Secondary | ICD-10-CM | POA: Diagnosis not present

## 2023-04-02 DIAGNOSIS — Z0000 Encounter for general adult medical examination without abnormal findings: Secondary | ICD-10-CM | POA: Diagnosis not present

## 2023-04-02 DIAGNOSIS — E1042 Type 1 diabetes mellitus with diabetic polyneuropathy: Secondary | ICD-10-CM | POA: Diagnosis not present

## 2023-04-02 DIAGNOSIS — E1069 Type 1 diabetes mellitus with other specified complication: Secondary | ICD-10-CM | POA: Diagnosis not present

## 2023-04-02 DIAGNOSIS — I152 Hypertension secondary to endocrine disorders: Secondary | ICD-10-CM | POA: Diagnosis not present

## 2023-04-02 DIAGNOSIS — E1159 Type 2 diabetes mellitus with other circulatory complications: Secondary | ICD-10-CM | POA: Diagnosis not present

## 2023-04-02 DIAGNOSIS — E785 Hyperlipidemia, unspecified: Secondary | ICD-10-CM | POA: Diagnosis not present

## 2023-04-02 DIAGNOSIS — Z1331 Encounter for screening for depression: Secondary | ICD-10-CM | POA: Diagnosis not present

## 2023-04-28 DIAGNOSIS — E109 Type 1 diabetes mellitus without complications: Secondary | ICD-10-CM | POA: Diagnosis not present

## 2023-04-28 DIAGNOSIS — I152 Hypertension secondary to endocrine disorders: Secondary | ICD-10-CM | POA: Diagnosis not present

## 2023-04-28 DIAGNOSIS — E785 Hyperlipidemia, unspecified: Secondary | ICD-10-CM | POA: Diagnosis not present

## 2023-04-28 DIAGNOSIS — E1069 Type 1 diabetes mellitus with other specified complication: Secondary | ICD-10-CM | POA: Diagnosis not present

## 2023-04-28 DIAGNOSIS — M810 Age-related osteoporosis without current pathological fracture: Secondary | ICD-10-CM | POA: Diagnosis not present

## 2023-04-28 DIAGNOSIS — E559 Vitamin D deficiency, unspecified: Secondary | ICD-10-CM | POA: Diagnosis not present

## 2023-04-28 DIAGNOSIS — E1159 Type 2 diabetes mellitus with other circulatory complications: Secondary | ICD-10-CM | POA: Diagnosis not present

## 2023-05-26 DIAGNOSIS — M48062 Spinal stenosis, lumbar region with neurogenic claudication: Secondary | ICD-10-CM | POA: Diagnosis not present

## 2023-09-21 ENCOUNTER — Ambulatory Visit: Admitting: Dermatology

## 2023-09-21 DIAGNOSIS — L80 Vitiligo: Secondary | ICD-10-CM | POA: Diagnosis not present

## 2023-09-21 DIAGNOSIS — Z85828 Personal history of other malignant neoplasm of skin: Secondary | ICD-10-CM | POA: Diagnosis not present

## 2023-09-21 DIAGNOSIS — R21 Rash and other nonspecific skin eruption: Secondary | ICD-10-CM | POA: Diagnosis not present

## 2023-09-21 DIAGNOSIS — L814 Other melanin hyperpigmentation: Secondary | ICD-10-CM | POA: Diagnosis not present

## 2023-09-21 DIAGNOSIS — W908XXA Exposure to other nonionizing radiation, initial encounter: Secondary | ICD-10-CM

## 2023-09-21 DIAGNOSIS — L570 Actinic keratosis: Secondary | ICD-10-CM

## 2023-09-21 DIAGNOSIS — Z7189 Other specified counseling: Secondary | ICD-10-CM

## 2023-09-21 DIAGNOSIS — Z1283 Encounter for screening for malignant neoplasm of skin: Secondary | ICD-10-CM

## 2023-09-21 DIAGNOSIS — L578 Other skin changes due to chronic exposure to nonionizing radiation: Secondary | ICD-10-CM

## 2023-09-21 DIAGNOSIS — Z79899 Other long term (current) drug therapy: Secondary | ICD-10-CM

## 2023-09-21 DIAGNOSIS — L820 Inflamed seborrheic keratosis: Secondary | ICD-10-CM

## 2023-09-21 DIAGNOSIS — D229 Melanocytic nevi, unspecified: Secondary | ICD-10-CM

## 2023-09-21 MED ORDER — MUPIROCIN 2 % EX OINT: TOPICAL_OINTMENT | CUTANEOUS | 1 refills | Status: AC

## 2023-09-21 NOTE — Patient Instructions (Addendum)

## 2023-09-21 NOTE — Progress Notes (Unsigned)
 Follow-Up Visit   Subjective  Denise Sherman is a 83 y.o. female who presents for the following: Skin Cancer Screening and Full Body Skin Exam Hx of bcc,  hx of aks and isks, patient did not do recommended red light treatment recommended back in June 2024  Few spots on face and legs.   The patient presents for Total-Body Skin Exam (TBSE) for skin cancer screening and mole check. The patient has spots, moles and lesions to be evaluated, some may be new or changing and the patient may have concern these could be cancer.  The following portions of the chart were reviewed this encounter and updated as appropriate: medications, allergies, medical history  Review of Systems:  No other skin or systemic complaints except as noted in HPI or Assessment and Plan.  Objective  Well appearing patient in no apparent distress; mood and affect are within normal limits.  A full examination was performed including scalp, head, eyes, ears, nose, lips, neck, chest, axillae, abdomen, back, buttocks, bilateral upper extremities, bilateral lower extremities, hands, feet, fingers, toes, fingernails, and toenails. All findings within normal limits unless otherwise noted below.   Relevant physical exam findings are noted in the Assessment and Plan.  right medial cheek x 1, left of midline upper lip x 1, chest x 3 (5) Erythematous thin papules/macules with gritty scale.  right shoulder x 2, left shoulder x 3, chest x 2, face x 10 (17) Erythematous stuck-on, waxy papule or plaque  Assessment & Plan   SKIN CANCER SCREENING PERFORMED TODAY.  ACTINIC DAMAGE - Chronic condition, secondary to cumulative UV/sun exposure - diffuse scaly erythematous macules with underlying dyspigmentation - Recommend daily broad spectrum sunscreen SPF 30+ to sun-exposed areas, reapply every 2 hours as needed.  - Staying in the shade or wearing long sleeves, sun glasses (UVA+UVB protection) and wide brim hats (4-inch brim around the  entire circumference of the hat) are also recommended for sun protection.  - Call for new or changing lesions.  LENTIGINES, SEBORRHEIC KERATOSES, HEMANGIOMAS - Benign normal skin lesions - Benign-appearing - Call for any changes  MELANOCYTIC NEVI - Tan-brown and/or pink-flesh-colored symmetric macules and papules - Benign appearing on exam today - Observation - Call clinic for new or changing moles - Recommend daily use of broad spectrum spf 30+ sunscreen to sun-exposed areas.   VITILIGO Exam: depigmented patches on back Vitiligo is a chronic autoimmune condition which causes loss of skin pigment and is commonly seen on the face and may also involve areas of trauma like hands, elbows, knees, and ankles. There is no cure and it is difficult to treat.  Treatments include topical steroids and other topical anti-inflammatory ointments/creams and topical and oral Jak inhibitors.  Sometimes narrow band UV light therapy or Xtrac laser is helpful, both of which require twice weekly treatments for at least 3-6 months.  Antioxidant vitamins, such as Vitamins A,C,E,D, Folic Acid and B12 may be added to enhance treatment. Heliocare may also enhance treatment results. Treatment Plan: Patient has had for many years and is not bothered by Discussed treatment Xtrac laswer Discussed opzelura cream  She declines treatment today   Rash Excoriations versus impetigo Exam: Red crusted papules at left upper arm Treatment Plan: Start mupirocin  2 % ointment - apply tid to aa's until healed.    HISTORY OF BASAL CELL CARCINOMA OF THE SKIN 04/25/2020 Left upper lip ED&C 10/21/2018 left superior forehead pigmented tx ED&C  - No evidence of recurrence today - Recommend regular  full body skin exams - Recommend daily broad spectrum sunscreen SPF 30+ to sun-exposed areas, reapply every 2 hours as needed.  - Call if any new or changing lesions are noted between office visits c   ACTINIC KERATOSIS (5) right  medial cheek x 1, left of midline upper lip x 1, chest x 3 (5) Actinic keratoses are precancerous spots that appear secondary to cumulative UV radiation exposure/sun exposure over time. They are chronic with expected duration over 1 year. A portion of actinic keratoses will progress to squamous cell carcinoma of the skin. It is not possible to reliably predict which spots will progress to skin cancer and so treatment is recommended to prevent development of skin cancer.  Recommend daily broad spectrum sunscreen SPF 30+ to sun-exposed areas, reapply every 2 hours as needed.  Recommend staying in the shade or wearing long sleeves, sun glasses (UVA+UVB protection) and wide brim hats (4-inch brim around the entire circumference of the hat). Call for new or changing lesions. Destruction of lesion - right medial cheek x 1, left of midline upper lip x 1, chest x 3 (5) Complexity: simple   Destruction method: cryotherapy   Informed consent: discussed and consent obtained   Timeout:  patient name, date of birth, surgical site, and procedure verified Lesion destroyed using liquid nitrogen: Yes   Region frozen until ice ball extended beyond lesion: Yes   Outcome: patient tolerated procedure well with no complications   Post-procedure details: wound care instructions given    INFLAMED SEBORRHEIC KERATOSIS (17) right shoulder x 2, left shoulder x 3, chest x 2, face x 10 (17) Symptomatic, irritating, patient would like treated. Destruction of lesion - right shoulder x 2, left shoulder x 3, chest x 2, face x 10 (17) Complexity: simple   Destruction method: cryotherapy   Informed consent: discussed and consent obtained   Timeout:  patient name, date of birth, surgical site, and procedure verified Lesion destroyed using liquid nitrogen: Yes   Region frozen until ice ball extended beyond lesion: Yes   Outcome: patient tolerated procedure well with no complications   Post-procedure details: wound care  instructions given    RASH AND OTHER NONSPECIFIC SKIN ERUPTION   Related Medications mupirocin  ointment (BACTROBAN ) 2 % Apply topically to rash at left upper arm tid until healed Return in about 1 year (around 09/20/2024) for TBSE.  IEleanor Blush, CMA, am acting as scribe for Alm Rhyme, MD.   Documentation: I have reviewed the above documentation for accuracy and completeness, and I agree with the above.  Alm Rhyme, MD

## 2023-09-22 ENCOUNTER — Encounter: Payer: Self-pay | Admitting: Dermatology

## 2023-09-24 DIAGNOSIS — Z79899 Other long term (current) drug therapy: Secondary | ICD-10-CM | POA: Diagnosis not present

## 2023-09-24 DIAGNOSIS — I152 Hypertension secondary to endocrine disorders: Secondary | ICD-10-CM | POA: Diagnosis not present

## 2023-09-24 DIAGNOSIS — E7800 Pure hypercholesterolemia, unspecified: Secondary | ICD-10-CM | POA: Diagnosis not present

## 2023-09-24 DIAGNOSIS — E1159 Type 2 diabetes mellitus with other circulatory complications: Secondary | ICD-10-CM | POA: Diagnosis not present

## 2023-10-01 DIAGNOSIS — R809 Proteinuria, unspecified: Secondary | ICD-10-CM | POA: Diagnosis not present

## 2023-10-01 DIAGNOSIS — E1159 Type 2 diabetes mellitus with other circulatory complications: Secondary | ICD-10-CM | POA: Diagnosis not present

## 2023-10-01 DIAGNOSIS — E1069 Type 1 diabetes mellitus with other specified complication: Secondary | ICD-10-CM | POA: Diagnosis not present

## 2023-10-01 DIAGNOSIS — Z79899 Other long term (current) drug therapy: Secondary | ICD-10-CM | POA: Diagnosis not present

## 2023-10-01 DIAGNOSIS — I152 Hypertension secondary to endocrine disorders: Secondary | ICD-10-CM | POA: Diagnosis not present

## 2023-10-01 DIAGNOSIS — M48062 Spinal stenosis, lumbar region with neurogenic claudication: Secondary | ICD-10-CM | POA: Diagnosis not present

## 2023-10-01 DIAGNOSIS — E1042 Type 1 diabetes mellitus with diabetic polyneuropathy: Secondary | ICD-10-CM | POA: Diagnosis not present

## 2023-10-01 DIAGNOSIS — E785 Hyperlipidemia, unspecified: Secondary | ICD-10-CM | POA: Diagnosis not present

## 2023-10-01 DIAGNOSIS — E7800 Pure hypercholesterolemia, unspecified: Secondary | ICD-10-CM | POA: Diagnosis not present

## 2023-10-29 DIAGNOSIS — E1069 Type 1 diabetes mellitus with other specified complication: Secondary | ICD-10-CM | POA: Diagnosis not present

## 2023-10-29 DIAGNOSIS — M810 Age-related osteoporosis without current pathological fracture: Secondary | ICD-10-CM | POA: Diagnosis not present

## 2023-10-29 DIAGNOSIS — E109 Type 1 diabetes mellitus without complications: Secondary | ICD-10-CM | POA: Diagnosis not present

## 2023-10-29 DIAGNOSIS — H43813 Vitreous degeneration, bilateral: Secondary | ICD-10-CM | POA: Diagnosis not present

## 2023-10-29 DIAGNOSIS — I152 Hypertension secondary to endocrine disorders: Secondary | ICD-10-CM | POA: Diagnosis not present

## 2023-10-29 DIAGNOSIS — E785 Hyperlipidemia, unspecified: Secondary | ICD-10-CM | POA: Diagnosis not present

## 2023-10-29 DIAGNOSIS — E559 Vitamin D deficiency, unspecified: Secondary | ICD-10-CM | POA: Diagnosis not present

## 2023-10-29 DIAGNOSIS — H04123 Dry eye syndrome of bilateral lacrimal glands: Secondary | ICD-10-CM | POA: Diagnosis not present

## 2023-10-29 DIAGNOSIS — Z961 Presence of intraocular lens: Secondary | ICD-10-CM | POA: Diagnosis not present

## 2023-12-10 DIAGNOSIS — E559 Vitamin D deficiency, unspecified: Secondary | ICD-10-CM | POA: Diagnosis not present

## 2023-12-10 DIAGNOSIS — E1069 Type 1 diabetes mellitus with other specified complication: Secondary | ICD-10-CM | POA: Diagnosis not present

## 2023-12-10 DIAGNOSIS — M810 Age-related osteoporosis without current pathological fracture: Secondary | ICD-10-CM | POA: Diagnosis not present

## 2023-12-10 DIAGNOSIS — I152 Hypertension secondary to endocrine disorders: Secondary | ICD-10-CM | POA: Diagnosis not present

## 2023-12-10 DIAGNOSIS — E785 Hyperlipidemia, unspecified: Secondary | ICD-10-CM | POA: Diagnosis not present

## 2023-12-10 DIAGNOSIS — E1159 Type 2 diabetes mellitus with other circulatory complications: Secondary | ICD-10-CM | POA: Diagnosis not present

## 2024-01-19 DIAGNOSIS — E109 Type 1 diabetes mellitus without complications: Secondary | ICD-10-CM | POA: Diagnosis not present

## 2024-01-19 DIAGNOSIS — M810 Age-related osteoporosis without current pathological fracture: Secondary | ICD-10-CM | POA: Diagnosis not present

## 2024-01-19 DIAGNOSIS — I152 Hypertension secondary to endocrine disorders: Secondary | ICD-10-CM | POA: Diagnosis not present

## 2024-01-19 DIAGNOSIS — E1159 Type 2 diabetes mellitus with other circulatory complications: Secondary | ICD-10-CM | POA: Diagnosis not present

## 2024-01-19 DIAGNOSIS — E785 Hyperlipidemia, unspecified: Secondary | ICD-10-CM | POA: Diagnosis not present

## 2024-01-19 DIAGNOSIS — E559 Vitamin D deficiency, unspecified: Secondary | ICD-10-CM | POA: Diagnosis not present

## 2024-01-19 DIAGNOSIS — E1069 Type 1 diabetes mellitus with other specified complication: Secondary | ICD-10-CM | POA: Diagnosis not present

## 2024-01-27 ENCOUNTER — Ambulatory Visit

## 2024-01-28 ENCOUNTER — Ambulatory Visit

## 2024-01-28 DIAGNOSIS — L820 Inflamed seborrheic keratosis: Secondary | ICD-10-CM | POA: Diagnosis not present

## 2024-01-28 DIAGNOSIS — L578 Other skin changes due to chronic exposure to nonionizing radiation: Secondary | ICD-10-CM | POA: Diagnosis not present

## 2024-01-28 DIAGNOSIS — W908XXA Exposure to other nonionizing radiation, initial encounter: Secondary | ICD-10-CM | POA: Diagnosis not present

## 2024-01-28 DIAGNOSIS — L814 Other melanin hyperpigmentation: Secondary | ICD-10-CM

## 2024-01-28 DIAGNOSIS — L570 Actinic keratosis: Secondary | ICD-10-CM

## 2024-01-28 DIAGNOSIS — L821 Other seborrheic keratosis: Secondary | ICD-10-CM

## 2024-01-28 NOTE — Patient Instructions (Addendum)
 Cryosurgery  Cryosurgery ("freezing") uses liquid nitrogen to destroy certain types of skin lesions. Lowering the temperature of the lesion in a small area surrounding skin destroys the lesion. Immediately following cryosurgery, you will notice redness and swelling of the treatment area. Blistering or weeping may occur, lasting approximately one week which will then be followed by crusting. Most areas will heal completely in 10 to 14 days.  Wash the treated areas daily. Allow soap and water to run over the areas, but do not scrub. Should a scab or crust form, allow it to fall off on its own. Do not remove or pick at it. Application of an ointment  and a bandage may make you feel more comfortable, but it is not necessary. Some people develop an allergy to Neosporin, so we recommend that Vaseline or  Aquaphor be used.  The cryotherapy site will be more sensitive than your surrounding skin. Keep it covered, and remember to apply sunscreen every day to all your sun exposed skin. A scar may remain which is lighter or pinker than your normal skin. Your body will continue to improve your scar for up to one year; however a light-colored scar may remain.  Infection following cryotherapy is rare. However if you are worried about the appearance of the treated area, contact your doctor. We have a physician on call at all times. If you have any concerns about the site, please call our clinic at [phone]   Due to recent changes in healthcare laws, you may see results of your pathology and/or laboratory studies on MyChart before the doctors have had a chance to review them. We understand that in some cases there may be results that are confusing or concerning to you. Please understand that not all results are received at the same time and often the doctors may need to interpret multiple results in order to provide you with the best plan of care or course of treatment. Therefore, we ask that you please give us  2  business days to thoroughly review all your results before contacting the office for clarification. Should we see a critical lab result, you will be contacted sooner.   If You Need Anything After Your Visit  If you have any questions or concerns for your doctor, please call our main line at [phone] and press option 4 to reach your doctor's medical assistant. If no one answers, please leave a voicemail as directed and we will return your call as soon as possible. Messages left after 4 pm will be answered the following business day.   You may also send us  a message via MyChart. We typically respond to MyChart messages within 1-2 business days.  For prescription refills, please ask your pharmacy to contact our office. Our fax number is [phone].  If you have an urgent issue when the clinic is closed that cannot wait until the next business day, you can page your doctor at the number below.    Please note that while we do our best to be available for urgent issues outside of office hours, we are not available 24/7.   If you have an urgent issue and are unable to reach us , you may choose to seek medical care at your doctor's office, retail clinic, urgent care center, or emergency room.  If you have a medical emergency, please immediately call 911 or go to the emergency department.  Pager Numbers  - Dr. Hester: [phone]  - Dr. Jackquline: [phone]  - Dr. Claudene: [phone]   -  Dr. Raymund: [phone]  In the event of inclement weather, please call our main line at [phone] for an update on the status of any delays or closures.  Dermatology Medication Tips: Please keep the boxes that topical medications come in in order to help keep track of the instructions about where and how to use these. Pharmacies typically print the medication instructions only on the boxes and not directly on the medication tubes.   If your medication is too expensive, please contact our office  at [phone] option 4 or send us  a message through MyChart.   We are unable to tell what your co-pay for medications will be in advance as this is different depending on your insurance coverage. However, we may be able to find a substitute medication at lower cost or fill out paperwork to get insurance to cover a needed medication.   If a prior authorization is required to get your medication covered by your insurance company, please allow us  1-2 business days to complete this process.  Drug prices often vary depending on where the prescription is filled and some pharmacies may offer cheaper prices.  The website www.goodrx.com contains coupons for medications through different pharmacies. The prices here do not account for what the cost may be with help from insurance (it may be cheaper with your insurance), but the website can give you the price if you did not use any insurance.  - You can print the associated coupon and take it with your prescription to the pharmacy.  - You may also stop by our office during regular business hours and pick up a GoodRx coupon card.  - If you need your prescription sent electronically to a different pharmacy, notify our office through [facility] or by phone at [phone] option 4.     Si Usted Necesita Algo Despus de Su Visita  Tambin puede enviarnos un mensaje a travs de Clinical Cytogeneticist. Por lo general respondemos a los mensajes de MyChart en el transcurso de 1 a 2 das hbiles.  Para renovar recetas, por favor pida a su farmacia que se ponga en contacto con nuestra oficina. Randi lakes de fax es [place] [phone].  Si tiene un asunto urgente cuando la clnica est cerrada y que no puede esperar hasta el siguiente da hbil, puede llamar/localizar a su doctor(a) al nmero que aparece a continuacin.   Por favor, tenga en cuenta que aunque hacemos todo lo posible para estar disponibles para asuntos urgentes fuera del horario de [place], no estamos  disponibles las 24 horas del da, los 7 [address]  [address] de la [place].   Si tiene un problema urgente y no puede comunicarse con nosotros, puede optar por buscar atencin mdica  en el consultorio de su doctor(a), en una clnica privada, en un centro de atencin urgente o en una sala de emergencias.  Si tiene engineer, drilling, por favor llame inmediatamente al 911 o vaya a la sala de emergencias.  Nmeros de bper  - Dr. Hester: [phone]  - Dra. Jackquline: [phone]  - Dr. Claudene: [phone]  - Dra. Kitts: [phone]  En caso de inclemencias del [place], por favor llame a nuestra lnea principal al [phone] para una actualizacin sobre el estado de cualquier retraso o cierre.  Consejos para la medicacin en dermatologa: Por favor, guarde las cajas en las que vienen los medicamentos de uso tpico para ayudarle a seguir las instrucciones sobre dnde y cmo usarlos. Las farmacias generalmente imprimen las instrucciones del medicamento slo en las cajas y  no directamente en los tubos del medicamento.   Si su medicamento es muy caro, por favor, pngase en contacto con landry rieger llamando al [phone] y presione la opcin 4 o envenos un mensaje a travs de Clinical Cytogeneticist.   No podemos decirle cul ser su copago por los medicamentos por adelantado ya que esto es diferente dependiendo de la cobertura de su seguro. Sin embargo, es posible que podamos encontrar un medicamento sustituto a audiological scientist un formulario para que el seguro cubra el medicamento que se considera necesario.   Si se requiere una autorizacin previa para que su compaa de seguros cubra su medicamento, por favor permtanos de 1 a 2 das hbiles para completar este proceso.  Los precios de los medicamentos varan con frecuencia dependiendo del environmental consultant de dnde se surte la receta y alguna farmacias pueden ofrecer precios ms baratos.  El sitio web www.goodrx.com tiene cupones para medicamentos de engineer, civil (consulting). Los precios aqu no tienen en cuenta lo que podra costar con la ayuda del seguro (puede ser ms barato con su seguro), pero el sitio web puede darle el precio si no utiliz tourist information centre manager.  - Puede imprimir el cupn correspondiente y llevarlo con su receta a la farmacia.  - Tambin puede pasar por nuestra oficina durante el horario de atencin regular y education officer, museum una tarjeta de cupones de GoodRx.  - Si necesita que su receta se enve electrnicamente a una farmacia diferente, informe a nuestra oficina a travs de MyChart de  o por telfono llamando al [phone] y presione la opcin 4.

## 2024-01-28 NOTE — Progress Notes (Signed)
 Subjective   Denise Sherman is a 83 y.o. female who presents for the following: Lesion(s) of concern . Patient is established patient   Today patient reports: LOC R side of nose, raised scab in scalp, Dr. Hester has removed, scaly areas Dr. Hester removed areas on chest and has noticed 2 more areas appear. Patient uses Cerave on face.  Review of Systems:    No other skin or systemic complaints except as noted in HPI or Assessment and Plan.  The following portions of the chart were reviewed this encounter and updated as appropriate: medications, allergies, medical history  Relevant Medical History:  Personal history of non melanoma skin cancer - see medical history for full details  and Personal history of actinic keratosis   Objective  (SKPE) Well appearing patient in no apparent distress; mood and affect are within normal limits. Examination was performed of the: Focused Exam of: face, ears, neck, chest   Examination notable for: Lentigo/lentigines: Scattered pigmented macules that are tan to brown in color and are somewhat non-uniform in shape and concentrated in the sun-exposed areas, Seborrheic Keratosis(es): Stuck-on appearing keratotic papule(s) on the trunk, some  irritated with redness, crusting, edema, and/or partial avulsion, Actinic Damage/Elastosis: chronic sun damage: dyspigmentation, telangiectasia, and wrinkling, Actinic keratosis: Scaly erythematous macule(s) concentrated on sun exposed areas   Examination limited by: Undergarments, Shoes or socks , and Clothing   L chest /L nasal bridge/temple x3 (3) Erythematous stuck-on, waxy papule or plaque Scalp x4, nose x1 (5) Erythematous thin papules/macules with gritty scale.  Assessment & Plan  (SKAP)   BENIGN SKIN FINDINGS  - Seborrheic keratoses  -Lentigines - Reassurance provided regarding the benign appearance of lesions noted on exam today; no treatment is indicated in the absence of symptoms/changes. -  Reinforced importance of photoprotective strategies including liberal and frequent sunscreen use of a broad-spectrum SPF 30 or greater, use of protective clothing, and sun avoidance for prevention of cutaneous malignancy and photoaging.  Counseled patient on the importance of regular self-skin monitoring as well as routine clinical skin examinations as scheduled.   ACTINIC DAMAGE - Chronic condition, secondary to cumulative UV/sun exposure - Recommend daily broad spectrum sunscreen SPF 30+ to sun-exposed areas, reapply every 2 hours as needed.  - Staying in the shade or wearing long sleeves, sun glasses (UVA+UVB protection) and wide brim hats (4-inch brim around the entire circumference of the hat) are also recommended for sun protection.  - Call for new or changing lesions.  Level of service outlined above   Patient instructions (SKPI)   Procedures, orders, diagnosis for this visit:  INFLAMED SEBORRHEIC KERATOSIS (3) L chest /L nasal bridge/temple x3 (3) Symptomatic, irritating, patient would like treated. Destruction of lesion - L chest /L nasal bridge/temple x3 (3) Complexity: simple   Destruction method: cryotherapy   Informed consent: discussed and consent obtained   Timeout:  patient name, date of birth, surgical site, and procedure verified Lesion destroyed using liquid nitrogen: Yes   Region frozen until ice ball extended beyond lesion: Yes   Cryo cycles: 1 or 2. Outcome: patient tolerated procedure well with no complications   Post-procedure details: wound care instructions given   Additional details:  Prior to procedure, discussed risks of blister formation, small wound, skin dyspigmentation, or rare scar following cryotherapy. Recommend Vaseline ointment to treated areas while healing.   AK (ACTINIC KERATOSIS) (5) Scalp x4, nose x1 (5) Actinic keratoses are precancerous spots that appear secondary to cumulative UV radiation exposure/sun exposure  over time. They are chronic  with expected duration over 1 year. A portion of actinic keratoses will progress to squamous cell carcinoma of the skin. It is not possible to reliably predict which spots will progress to skin cancer and so treatment is recommended to prevent development of skin cancer.  Recommend daily broad spectrum sunscreen SPF 30+ to sun-exposed areas, reapply every 2 hours as needed.  Recommend staying in the shade or wearing long sleeves, sun glasses (UVA+UVB protection) and wide brim hats (4-inch brim around the entire circumference of the hat). Call for new or changing lesions. Destruction of lesion - Scalp x4, nose x1 (5) Complexity: simple   Destruction method: cryotherapy   Informed consent: discussed and consent obtained   Timeout:  patient name, date of birth, surgical site, and procedure verified Lesion destroyed using liquid nitrogen: Yes   Region frozen until ice ball extended beyond lesion: Yes   Cryo cycles: 1 or 2. Outcome: patient tolerated procedure well with no complications   Post-procedure details: wound care instructions given   Additional details:  Prior to procedure, discussed risks of blister formation, small wound, skin dyspigmentation, or rare scar following cryotherapy. Recommend Vaseline ointment to treated areas while healing.    Inflamed seborrheic keratosis -     Destruction of lesion  AK (actinic keratosis) -     Destruction of lesion    Return to clinic: Return for As scheduled, w/ Dr. Hester, TBSE.  I, Denise Sherman, RMA, am acting as scribe for Denise JAYSON Kanaris, MD .   Documentation: I have reviewed the above documentation for accuracy and completeness, and I agree with the above.  Denise JAYSON Kanaris, MD

## 2024-09-20 ENCOUNTER — Ambulatory Visit: Admitting: Dermatology
